# Patient Record
Sex: Female | Born: 1980 | Race: Black or African American | Hispanic: No | Marital: Single | Smoking: Current every day smoker
Health system: Southern US, Community
[De-identification: ages and names within clinical notes are randomized; demographics above are authoritative.]

## PROBLEM LIST (undated history)

## (undated) DIAGNOSIS — F319 Bipolar disorder, unspecified: Secondary | ICD-10-CM

## (undated) DIAGNOSIS — F4310 Post-traumatic stress disorder, unspecified: Secondary | ICD-10-CM

## (undated) HISTORY — PX: HAND SURGERY: SHX662

## (undated) HISTORY — PX: ABDOMINAL HYSTERECTOMY: SHX81

---

## 2009-03-22 ENCOUNTER — Encounter: Admission: RE | Admit: 2009-03-22 | Discharge: 2009-03-22 | Payer: Self-pay | Admitting: Emergency Medicine

## 2009-08-08 ENCOUNTER — Emergency Department (HOSPITAL_COMMUNITY): Admission: EM | Admit: 2009-08-08 | Discharge: 2009-08-09 | Payer: Self-pay | Admitting: Emergency Medicine

## 2009-08-26 ENCOUNTER — Ambulatory Visit (HOSPITAL_COMMUNITY): Admission: RE | Admit: 2009-08-26 | Discharge: 2009-08-26 | Payer: Self-pay | Admitting: Orthopedic Surgery

## 2010-01-08 ENCOUNTER — Emergency Department (HOSPITAL_COMMUNITY)
Admission: EM | Admit: 2010-01-08 | Discharge: 2010-01-08 | Payer: Self-pay | Source: Home / Self Care | Admitting: Emergency Medicine

## 2010-04-08 LAB — COMPREHENSIVE METABOLIC PANEL
AST: 25 U/L (ref 0–37)
Albumin: 4.4 g/dL (ref 3.5–5.2)
Alkaline Phosphatase: 73 U/L (ref 39–117)
CO2: 27 mEq/L (ref 19–32)
Chloride: 106 mEq/L (ref 96–112)
Total Bilirubin: 0.9 mg/dL (ref 0.3–1.2)

## 2010-04-08 LAB — SURGICAL PCR SCREEN
MRSA, PCR: NEGATIVE
Staphylococcus aureus: NEGATIVE

## 2010-04-08 LAB — CBC
HCT: 44.8 % (ref 39.0–52.0)
Hemoglobin: 15.7 g/dL (ref 13.0–17.0)
Platelets: 262 10*3/uL (ref 150–400)
RDW: 12.2 % (ref 11.5–15.5)
WBC: 10 10*3/uL (ref 4.0–10.5)

## 2012-08-27 ENCOUNTER — Emergency Department (HOSPITAL_COMMUNITY)
Admission: EM | Admit: 2012-08-27 | Discharge: 2012-08-27 | Disposition: A | Discharge status: Home / Self Care | Payer: Self-pay | Attending: Emergency Medicine | Admitting: Emergency Medicine

## 2012-08-27 ENCOUNTER — Encounter (HOSPITAL_COMMUNITY): Payer: Self-pay

## 2012-08-27 DIAGNOSIS — F1010 Alcohol abuse, uncomplicated: Secondary | ICD-10-CM | POA: Insufficient documentation

## 2012-08-27 DIAGNOSIS — R45851 Suicidal ideations: Secondary | ICD-10-CM | POA: Insufficient documentation

## 2012-08-27 DIAGNOSIS — F10920 Alcohol use, unspecified with intoxication, uncomplicated: Secondary | ICD-10-CM

## 2012-08-27 LAB — CBC
HCT: 39.2 % (ref 39.0–52.0)
Hemoglobin: 13.4 g/dL (ref 13.0–17.0)
MCH: 30.8 pg (ref 26.0–34.0)

## 2012-08-27 LAB — RAPID URINE DRUG SCREEN, HOSP PERFORMED
Amphetamines: NOT DETECTED
Barbiturates: NOT DETECTED
Cocaine: NOT DETECTED
Tetrahydrocannabinol: POSITIVE — AB

## 2012-08-27 LAB — POCT PREGNANCY, URINE: Preg Test, Ur: NEGATIVE

## 2012-08-27 LAB — COMPREHENSIVE METABOLIC PANEL
AST: 22 U/L (ref 0–37)
Albumin: 4.3 g/dL (ref 3.5–5.2)
Alkaline Phosphatase: 68 U/L (ref 39–117)
CO2: 28 mEq/L (ref 19–32)
Creatinine, Ser: 0.82 mg/dL (ref 0.50–1.35)
Glucose, Bld: 89 mg/dL (ref 70–99)
Potassium: 4.1 mEq/L (ref 3.5–5.1)
Sodium: 141 mEq/L (ref 135–145)

## 2012-08-27 LAB — ETHANOL: Alcohol, Ethyl (B): 76 mg/dL — ABNORMAL HIGH (ref 0–11)

## 2012-08-27 NOTE — ED Notes (Signed)
Dr Juleen China is coming to re-evaluate the patient

## 2012-08-27 NOTE — ED Notes (Signed)
Pt refuses to change into blue scrubs, Dr Juleen China, the [employer], and the nursing staff have tried to talk to the patient and explain the policy. Pt continues to refuse, but is cooperative with blood draw and urine sample

## 2012-08-27 NOTE — ED Notes (Signed)
Pt brought in by police, being IVC by her girlfriend, reported to have jumped out of a car, rode a train, jumped off train, pt has been making suicidal statements.

## 2012-08-27 NOTE — ED Provider Notes (Signed)
  CSN: [id number]     Arrival date & time 08/27/12  0340 History     First MD Initiated Contact with Patient 08/27/12 [phone]     Chief Complaint  Patient presents with  . Medical Clearance   (Consider location/radiation/quality/duration/timing/severity/associated sxs/prior Treatment) HPI  32 year old female on in by police with IVC paperwork. IVC was filled out by patient's girlfriend. Paperwork alleged pt jumped out of a car and also off a train and had been making suicidal statements. Patient's admits to alcohol intoxication. She states that she did get out of car, but that it was not moving at the time. She states that she hopped onto a slow moving train and then jumped off. She states that this behavior was because she was intoxicated and specifically denies intent to harm herself. No homicidal ideation. Denies any ingestion aside from alcohol. No hallucinations. Does not want help for ETOH.   History reviewed. No pertinent past medical history. History reviewed. No pertinent past surgical history. History reviewed. No pertinent family history. History  Substance Use Topics  . Smoking status: Not on file  . Smokeless tobacco: Not on file  . Alcohol Use: Yes    Review of Systems  Level 5 caveat applies because pt is intoxicated.   Allergies  Review of patient's allergies indicates no known allergies.  Home Medications  No current outpatient prescriptions on file. BP 124/81  Pulse 96  Temp(Src) 98.2 F (36.8 C) (Oral)  Resp 15  SpO2 98% Physical Exam  Nursing note and vitals reviewed. Constitutional: He appears well-developed and well-nourished. No distress.  etoh on breath  HENT:  Head: Normocephalic and atraumatic.  Eyes: Conjunctivae are normal. Right eye exhibits no discharge. Left eye exhibits no discharge.  Neck: Neck supple.  Cardiovascular: Normal rate, regular rhythm and normal heart sounds.  Exam reveals no gallop and no friction rub.   No murmur  heard. Pulmonary/Chest: Effort normal and breath sounds normal. No respiratory distress.  Abdominal: Soft. He exhibits no distension. There is no tenderness.  Musculoskeletal: He exhibits no edema and no tenderness.  Neurological: He is alert.  Skin: Skin is warm and dry.  Psychiatric:  Appears intoxicated. Inappropriately loud at times. Some raging when asked to put on paper scrubs.     ED Course   Procedures (including critical care time)  Labs Reviewed  URINE RAPID DRUG SCREEN (HOSP PERFORMED) - Abnormal; Notable for the following:    Tetrahydrocannabinol POSITIVE (*)    All other components within normal limits  CBC  COMPREHENSIVE METABOLIC PANEL  ETHANOL  POCT PREGNANCY, URINE   No results found. 1. Alcohol intoxication, uncomplicated     MDM  32 year old female brought in with IVC paperwork alleged laying suicidal gestures. Patient denies. Reliability is questionable given her intoxication. Will observe until clinically sober and readdress these concerns.  Raeford Razor, MD 08/27/12 [phone]

## 2012-08-27 NOTE — ED Notes (Signed)
Pt was a [employer] in [place] and her girlfriend says "she hasn't been right since"

## 2012-08-27 NOTE — ED Notes (Signed)
Pt sleeping, in no acute distress, pt still refuses to change clothes, Dr Juleen China aware

## 2013-03-19 ENCOUNTER — Encounter (HOSPITAL_COMMUNITY): Payer: Self-pay | Admitting: Emergency Medicine

## 2013-03-19 ENCOUNTER — Emergency Department (HOSPITAL_COMMUNITY)
Admission: EM | Admit: 2013-03-19 | Discharge: 2013-03-19 | Disposition: A | Discharge status: Home / Self Care | Payer: Self-pay | Attending: Emergency Medicine | Admitting: Emergency Medicine

## 2013-03-19 ENCOUNTER — Emergency Department (HOSPITAL_COMMUNITY): Payer: Self-pay

## 2013-03-19 DIAGNOSIS — Y939 Activity, unspecified: Secondary | ICD-10-CM | POA: Insufficient documentation

## 2013-03-19 DIAGNOSIS — W2209XA Striking against other stationary object, initial encounter: Secondary | ICD-10-CM | POA: Insufficient documentation

## 2013-03-19 DIAGNOSIS — S62309A Unspecified fracture of unspecified metacarpal bone, initial encounter for closed fracture: Secondary | ICD-10-CM | POA: Insufficient documentation

## 2013-03-19 DIAGNOSIS — F17200 Nicotine dependence, unspecified, uncomplicated: Secondary | ICD-10-CM | POA: Insufficient documentation

## 2013-03-19 DIAGNOSIS — Z8659 Personal history of other mental and behavioral disorders: Secondary | ICD-10-CM | POA: Insufficient documentation

## 2013-03-19 DIAGNOSIS — Y929 Unspecified place or not applicable: Secondary | ICD-10-CM | POA: Insufficient documentation

## 2013-03-19 DIAGNOSIS — S62306A Unspecified fracture of fifth metacarpal bone, right hand, initial encounter for closed fracture: Secondary | ICD-10-CM

## 2013-03-19 HISTORY — DX: Post-traumatic stress disorder, unspecified: F43.10

## 2013-03-19 MED ORDER — HYDROCODONE-ACETAMINOPHEN 5-325 MG PO TABS: 1.0000 | ORAL_TABLET | ORAL | Status: DC | PRN

## 2013-03-19 NOTE — ED Provider Notes (Signed)
CSN: [id number]     Arrival date & time 03/19/13  1627 History  This chart was scribed for non-physician practitioner, Lowella DellGray Ariannie Penaloza, PA-C working with Gerhard Munchobert Lockwood, MD by Luisa DagoPriscilla Tutu, ED scribe. This patient was seen in room TR08C/TR08C and the patient's care was started at 6:46 PM.    Chief Complaint  Patient presents with  . Hand Injury   The history is provided by the patient. No language interpreter was used.   HPI Comments: Linda Small is a 33 y.o. female who presents to the Emergency Department complaining of a hand injury that occurred 1 day ago. Pt states that she was angered by her therapist. So to release her anger she punched a brick wall. 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 Pt reports a similar episode last year when she punched a wall and broke her hand. to the same hand. She states that it hurts to put her hand in a fist, but rates her current pain as a 4/10.   PMH significant for Hand surgery for fracture of Right Fifth metatarsal.   Past Medical History  Diagnosis Date  . PTSD (post-traumatic stress disorder)    Past Surgical History  Procedure Laterality Date  . Hand surgery    . Abdominal hysterectomy     History reviewed. No pertinent family history. History  Substance Use Topics  . Smoking status: Current Every Day Smoker    Types: Cigars  . Smokeless tobacco: Not on file     Comment: one black and mild a day  . Alcohol Use: Yes     Comment: occasionally   OB History   Grav Para Term Preterm Abortions TAB SAB Ect Mult Living                 Review of Systems  Musculoskeletal: Negative for joint swelling.  Skin: Positive for wound (right hand).  All other systems reviewed and are negative.      Allergies  Review of patient's allergies indicates no known allergies.  Home Medications   Current Outpatient Rx  Name  Route  Sig   Dispense  Refill  . HYDROcodone-acetaminophen (NORCO/VICODIN) 5-325 MG per tablet   Oral   Take 1 tablet by mouth every 4 (four) hours as needed.   15 tablet   0     BP 123/86  Pulse 93  Temp(Src) 98.2 F (36.8 C) (Oral)  Resp 18  Wt 112 lb (50.803 kg)  SpO2 99%  Physical Exam  Nursing note and vitals reviewed. Constitutional: She is oriented to person, place, and time. She appears well-developed and well-nourished. No distress.  HENT:  Head: Normocephalic and atraumatic.  Eyes: Conjunctivae and EOM are normal.  Neck: Neck supple. No JVD present. No tracheal deviation present.  Cardiovascular: Normal rate and regular rhythm.  Exam reveals no gallop and no friction rub.   No murmur heard. Pulmonary/Chest: Effort normal. No respiratory distress. She has no wheezes. She has no rhonchi. She has no rales.  Musculoskeletal: Normal range of motion. She exhibits no edema.       Arms: Tenderness at distal end of 5th metacarpal of RIght hand, with associated swelling and ecchymosis.   Neurological: She is alert and oriented to person, place, and time.  Skin: Skin is warm and dry. She is not diaphoretic.  Psychiatric: She has a normal mood and affect.  Her behavior is normal.    ED Course  Procedures (including critical care time)  DIAGNOSTIC STUDIES: Oxygen Saturation is 99% on RA, normal by my interpretation.    COORDINATION OF CARE: 6:53 PM- Will order a splint. Pt advised of plan for treatment and pt agrees.  Labs Review Labs Reviewed - No data to display Imaging Review Dg Hand Complete Right  03/19/2013   CLINICAL DATA:  Injury.  EXAM: RIGHT HAND - COMPLETE 3+ VIEW  COMPARISON:  DG HAND COMPLETE*R* dated 08/08/2009  FINDINGS: There is a new distal right fifth metacarpal angulated fracture. The patient has had prior plate and screw fixation of a mid right fifth metacarpal fracture. This has healed. No other abnormality.  IMPRESSION: 1. New fracture of the distal shaft of the  right fifth metacarpal with mild volar angulation deformity. 2. Old healed fracture right mid fifth metacarpal with plate and screw fixation .   Electronically Signed   By: Maisie Fus  Register   On: 03/19/2013 17:42    EKG Interpretation   None       MDM   Final diagnoses:  Fracture of fifth metacarpal bone of right hand   Vital Signs WNL Plain films show new fracture to distal shaft of right fifth metacarpal.  Plan to have patient follow up with orthopedics for further management of fracture. Patient placed in ulnar gutter splint. Patient agrees with plan. Discharged in good condition.   Meds given in ED:  Medications - No data to display  Discharge Medication List as of 03/19/2013  7:46 PM    START taking these medications   Details  HYDROcodone-acetaminophen (NORCO/VICODIN) 5-325 MG per tablet Take 1 tablet by mouth every 4 (four) hours as needed., Starting 03/19/2013, Until Discontinued, Print        I personally performed the services described in this documentation, which was scribed in my presence. The recorded information has been reviewed and is accurate.    Rudene Anda, PA-C 03/20/13 2137

## 2013-03-19 NOTE — Discharge Instructions (Signed)
Follow up with orthopedics as soon as possible for further management of your fracture.    Boxer's Fracture You have a break (fracture) of the fifth metacarpal bone. This is commonly called a boxer's fracture. This is the bone in the hand where the little finger attaches. The fracture is in the end of that bone, closest to the little finger. It is usually caused when you hit an object with a clenched fist. Often, the knuckle is pushed down by the impact. Sometimes, the fracture rotates out of position. A boxer's fracture will usually heal within 6 weeks, if it is treated properly and protected from re-injury. Surgery is sometimes needed. A cast, splint, or bulky hand dressing may be used to protect and immobilize a boxer's fracture. Do not remove this device or dressing until your caregiver approves. Keep your hand elevated, and apply ice packs for 15-20 minutes every 2 hours, for the first 2 days. Elevation and ice help reduce swelling and relieve pain. See your caregiver, or an orthopedic specialist, for follow-up care within the next 10 days. This is to make sure your fracture is healing properly. Document Released: 01/09/2005 Document Revised: 04/03/2011 Document Reviewed: 06/29/2006 [facility] Patient Information 2014 [place], [place].  Cast or Splint Care Casts and splints support injured limbs and keep bones from moving while they heal.  HOME CARE  Keep the cast or splint uncovered during the drying period.  A plaster cast can take 24 to 48 hours to dry.  A fiberglass cast will dry in less than 1 hour.  Do not rest the cast on anything harder than a pillow for 24 hours.  Do not put weight on your injured limb. Do not put pressure on the cast. Wait for your doctor's approval.  Keep the cast or splint dry.  Cover the cast or splint with a plastic bag during baths or wet weather.  If you have a cast over your chest and belly (trunk), take sponge baths until the cast is taken off.  If  your cast gets wet, dry it with a towel or blow dryer. Use the cool setting on the blow dryer.  Keep your cast or splint clean. Wash a dirty cast with a damp cloth.  Do not put any objects under your cast or splint.  Do not scratch the skin under the cast with an object. If itching is a problem, use a blow dryer on a cool setting over the itchy area.  Do not trim or cut your cast.  Do not take out the padding from inside your cast.  Exercise your joints near the cast as told by your doctor.  Raise (elevate) your injured limb on 1 or 2 pillows for the first 1 to 3 days. GET HELP IF:  Your cast or splint cracks.  Your cast or splint is too tight or too loose.  You itch badly under the cast.  Your cast gets wet or has a soft spot.  You have a bad smell coming from the cast.  You get an object stuck under the cast.  Your skin around the cast becomes red or sore.  You have new or more pain after the cast is put on. GET HELP RIGHT AWAY IF:  You have fluid leaking through the cast.  You cannot move your fingers or toes.  Your fingers or toes turn blue or white or are cool, painful, or puffy (swollen).  You have tingling or lose feeling (numbness) around the injured area.  You have bad pain or pressure under the cast.  You have trouble breathing or have shortness of breath.  You have chest pain. Document Released: 05/11/2010 Document Revised: 09/11/2012 Document Reviewed: 07/18/2012 [facility] Patient Information 2014 [place].

## 2013-03-19 NOTE — ED Notes (Signed)
Pt. Stated, i got mad and hit the wall injured rt. Hand.

## 2013-03-19 NOTE — Progress Notes (Signed)
Orthopedic Tech Progress Note Patient Details:  Linda Small 1980/06/18 [id number]  Ortho Devices Type of Ortho Device: Ace wrap;Ulna gutter splint Ortho Device/Splint Location: RUE Ortho Device/Splint Interventions: Ordered;Application   Jennye MoccasinHughes, [name] 03/19/2013, 7:10 PM

## 2013-03-19 NOTE — ED Notes (Signed)
PT ambulated with baseline gait; VSS; A&Ox3; no signs of distress; respirations even and unlabored; skin warm and dry; no questions upon discharge.  

## 2013-03-19 NOTE — ED Notes (Signed)
Pt reports hitting wall yesterday, having pain and limited movement in hand; pt reports has a plate in hand from previous injury

## 2013-03-20 NOTE — ED Provider Notes (Signed)
Medical screening examination/treatment/procedure(s) were performed by non-physician practitioner and as supervising physician I was immediately available for consultation/collaboration.  Gerhard Munchobert Kanika Bungert, MD 03/20/13 [phone]

## 2014-02-25 ENCOUNTER — Encounter (HOSPITAL_COMMUNITY): Payer: Self-pay | Admitting: *Deleted

## 2014-02-25 ENCOUNTER — Emergency Department (HOSPITAL_COMMUNITY)
Admission: EM | Admit: 2014-02-25 | Discharge: 2014-02-28 | Disposition: A | Discharge status: Other Acute Inpatient Hospital | Payer: Self-pay | Attending: Emergency Medicine | Admitting: Emergency Medicine

## 2014-02-25 DIAGNOSIS — W19XXXA Unspecified fall, initial encounter: Secondary | ICD-10-CM

## 2014-02-25 DIAGNOSIS — F1210 Cannabis abuse, uncomplicated: Secondary | ICD-10-CM | POA: Insufficient documentation

## 2014-02-25 DIAGNOSIS — F29 Unspecified psychosis not due to a substance or known physiological condition: Secondary | ICD-10-CM | POA: Diagnosis present

## 2014-02-25 DIAGNOSIS — Z720 Tobacco use: Secondary | ICD-10-CM | POA: Insufficient documentation

## 2014-02-25 DIAGNOSIS — R000 Tachycardia, unspecified: Secondary | ICD-10-CM | POA: Insufficient documentation

## 2014-02-25 DIAGNOSIS — F319 Bipolar disorder, unspecified: Secondary | ICD-10-CM | POA: Diagnosis present

## 2014-02-25 DIAGNOSIS — Z3202 Encounter for pregnancy test, result negative: Secondary | ICD-10-CM | POA: Insufficient documentation

## 2014-02-25 DIAGNOSIS — F4310 Post-traumatic stress disorder, unspecified: Secondary | ICD-10-CM | POA: Diagnosis present

## 2014-02-25 DIAGNOSIS — F1310 Sedative, hypnotic or anxiolytic abuse, uncomplicated: Secondary | ICD-10-CM | POA: Insufficient documentation

## 2014-02-25 HISTORY — DX: Bipolar disorder, unspecified: F31.9

## 2014-02-25 LAB — COMPREHENSIVE METABOLIC PANEL
ALT: 27 U/L (ref 0–35)
ANION GAP: 13 (ref 5–15)
AST: 37 U/L (ref 0–37)
Albumin: 4.9 g/dL (ref 3.5–5.2)
Alkaline Phosphatase: 63 U/L (ref 39–117)
BILIRUBIN TOTAL: 0.9 mg/dL (ref 0.3–1.2)
BUN: 16 mg/dL (ref 6–23)
CO2: 23 mmol/L (ref 19–32)
Calcium: 10 mg/dL (ref 8.4–10.5)
Chloride: 105 mmol/L (ref 96–112)
Creatinine, Ser: 0.84 mg/dL (ref 0.50–1.10)
GFR calc Af Amer: >90 mL/min (ref >90)
GLUCOSE: 102 mg/dL — AB (ref 70–99)
POTASSIUM: 3.5 mmol/L (ref 3.5–5.1)
Sodium: 141 mmol/L (ref 135–145)
TOTAL PROTEIN: 8 g/dL (ref 6.0–8.3)

## 2014-02-25 LAB — PREGNANCY, URINE: Preg Test, Ur: NEGATIVE

## 2014-02-25 LAB — CBC
HEMATOCRIT: 39.1 % (ref 36.0–46.0)
HEMOGLOBIN: 13.3 g/dL (ref 12.0–15.0)
MCH: 31.6 pg (ref 26.0–34.0)
MCHC: 34 g/dL (ref 30.0–36.0)
MCV: 92.9 fL (ref 78.0–100.0)
Platelets: 274 10*3/uL (ref 150–400)
RBC: 4.21 MIL/uL (ref 3.87–5.11)
RDW: 13.5 % (ref 11.5–15.5)
WBC: 14.6 10*3/uL — AB (ref 4.0–10.5)

## 2014-02-25 LAB — RAPID URINE DRUG SCREEN, HOSP PERFORMED
AMPHETAMINES: NOT DETECTED
Barbiturates: NOT DETECTED
Benzodiazepines: POSITIVE — AB
COCAINE: NOT DETECTED
OPIATES: NOT DETECTED
Tetrahydrocannabinol: POSITIVE — AB

## 2014-02-25 LAB — ETHANOL

## 2014-02-25 LAB — SALICYLATE LEVEL

## 2014-02-25 LAB — ACETAMINOPHEN LEVEL

## 2014-02-25 MED ORDER — LORAZEPAM 1 MG PO TABS
1.0000 mg | ORAL_TABLET | Freq: Three times a day (TID) | ORAL | Status: DC | PRN
  Administered 2014-02-26 – 2014-02-27 (×3): 1 mg via ORAL
  Filled 2014-02-25 (×4): qty 1

## 2014-02-25 MED ORDER — ACETAMINOPHEN 325 MG PO TABS
650.0000 mg | ORAL_TABLET | ORAL | Status: DC | PRN
  Filled 2014-02-25: qty 2

## 2014-02-25 MED ORDER — SODIUM CHLORIDE 0.9 % IV BOLUS (SEPSIS)
1000.0000 mL | Freq: Once | INTRAVENOUS | Status: AC
  Administered 2014-02-25: 1000 mL via INTRAVENOUS

## 2014-02-25 MED ORDER — ONDANSETRON HCL 4 MG PO TABS: 4.0000 mg | ORAL_TABLET | Freq: Three times a day (TID) | ORAL | Status: DC | PRN

## 2014-02-25 MED ORDER — ALUM & MAG HYDROXIDE-SIMETH 200-200-20 MG/5ML PO SUSP: 30.0000 mL | ORAL | Status: DC | PRN

## 2014-02-25 MED ORDER — LORAZEPAM 2 MG/ML IJ SOLN
1.0000 mg | Freq: Once | INTRAMUSCULAR | Status: AC
  Administered 2014-02-25: 1 mg via INTRAVENOUS
  Filled 2014-02-25: qty 1

## 2014-02-25 MED ORDER — ZOLPIDEM TARTRATE 5 MG PO TABS
5.0000 mg | ORAL_TABLET | Freq: Every evening | ORAL | Status: DC | PRN
  Administered 2014-02-26: 5 mg via ORAL
  Filled 2014-02-25: qty 1

## 2014-02-25 MED ORDER — IBUPROFEN 200 MG PO TABS
600.0000 mg | ORAL_TABLET | Freq: Three times a day (TID) | ORAL | Status: DC | PRN
Start: 2014-02-25 — End: 2014-02-28
  Administered 2014-02-27: 600 mg via ORAL
  Filled 2014-02-25 (×2): qty 3

## 2014-02-25 NOTE — ED Notes (Addendum)
Per EMS, pt from home, reports hx of PTSD and has been having "outbursts."  Reports ran around naked outside.  Pt also has hx of bipolar.   Pt was given 5mg  versed en route.  Pt is asleep at present.  Pt's friend at bedside reports that at times, pt would make "gun sounds" and would say to her that she is at war and is trying to "fight off the demon."  She also reports that when she arrived in pt's house, pt was sitting "[place] style and was leaning back and forth."  Pt's mother reports that pt was told that she has bipolar [facility] but was not placed on meds.

## 2014-02-25 NOTE — ED Provider Notes (Signed)
CSN: [id number]     Arrival date & time 02/25/14  1741 History   First MD Initiated Contact with Patient 02/25/14 1752     Chief Complaint  Patient presents with  . Medical Clearance     (Consider location/radiation/quality/duration/timing/severity/associated sxs/prior Treatment) HPI Comments: Linda Small is a 34 y.o. female with a PMHx of PTSD and bipolar 1 disorder, who presents to the ED via EMS accompanied by her mother who provides all of the history along with EMS reports due to the pt being given versed and being asleep at the time of arrival. Level 5 caveat, pt somnolent due to meds given and unable to provide any history. Patient's mother reports that last Wednesday she had some increase in stress due to a job working out, and her friend mention to the mom that she has been pretending like she is at war, stating that she is trying to "fight off demons" and making gun sounds pointing her finger as if it were gun. Her mother reports that today the EMS arrived, and that initially the patient was "sitting [place] style and rocking back and forth", but that the patient eventually willingly got into the ambulance and took a shot of medication. Per EMS report, the patient was given 5 mg of Versed en Route which has caused her to be completely somnolent. Mother states that she doesn't know if the pt has been suicidal or not, but that she has a hx of bipolar and PTSD and isn't currently medicated. [facility]. Mother denies any medical conditions, but can't report whether the pt has had any medical complaints recently. Unable to obtain full history.  Patient is a 34 y.o. female presenting with mental health disorder. The history is provided by the patient and the EMS personnel. No language interpreter was used.  Mental Health Problem Presenting symptoms: bizarre behavior and hallucinations   Patient accompanied by:  Family member Onset quality:   Gradual Duration:  1 week Timing:  Constant Progression:  Worsening Chronicity:  Recurrent Context: stressful life event   Treatment compliance:  Untreated Relieved by:  None tried Worsened by:  Nothing tried Ineffective treatments:  None tried Risk factors: hx of mental illness and recent psychiatric admission     Past Medical History  Diagnosis Date  . PTSD (post-traumatic stress disorder)   . Bipolar 1 disorder    Past Surgical History  Procedure Laterality Date  . Hand surgery    . Abdominal hysterectomy     No family history on file. History  Substance Use Topics  . Smoking status: Current Every Day Smoker    Types: Cigars  . Smokeless tobacco: Not on file     Comment: one black and mild a day  . Alcohol Use: Yes     Comment: occasionally   OB History    No data available      LEVEL 5 CAVEAT: PT SLEEPING DUE TO VERSED GIVEN PTA, UNABLE TO PROVIDE HISTORY  Review of Systems  Unable to perform ROS: Other  Psychiatric/Behavioral: Positive for hallucinations.   10 Systems reviewed and are negative for acute change except as noted in the HPI.    Allergies  Review of patient's allergies indicates no known allergies.  Home Medications   Prior to Admission medications   Medication Sig Start Date End Date Taking? Authorizing Provider  HYDROcodone-acetaminophen (NORCO/VICODIN) 5-325 MG per tablet Take 1 tablet by mouth every 4 (four) hours as needed.  03/19/13   Rudene Anda, PA-C   BP 98/66 mmHg  Pulse 91  Temp(Src) 97.8 F (36.6 C) (Axillary)  Resp 20  SpO2 96% Physical Exam  Constitutional: She appears well-developed and well-nourished. She is sleeping.  Non-toxic appearance. No distress.  Afebrile nontoxic, sleeping and not easily aroused, snoring respirations, BP slightly low at 98/66 but pt stable at this time  HENT:  Head: Normocephalic and atraumatic.  Mouth/Throat: Mucous membranes are normal.  Eyes: Conjunctivae are normal. Pupils are equal,  round, and reactive to light. Right eye exhibits no discharge. Left eye exhibits no discharge.  Unable to assess EOM due to pt sleeping  Neck: Normal range of motion. Neck supple.  Cardiovascular: Normal rate, regular rhythm, normal heart sounds and intact distal pulses.  Exam reveals no gallop and no friction rub.   No murmur heard. Pulmonary/Chest: Effort normal and breath sounds normal. No respiratory distress. She has no decreased breath sounds. She has no wheezes. She has no rhonchi. She has no rales.  Snoring respirations but no w/r/r, CTAB in all lung fields, no hypoxia or increased WOB  Abdominal: Soft. Normal appearance and bowel sounds are normal. She exhibits no distension.  Soft, nondistended, +BS throughout. Unable to verify if pt is nontender due to patient being asleep  Musculoskeletal:  Pt asleep, unable to assess  Neurological:  Unable to assess due to pt sleeping  Skin: Skin is warm, dry and intact. No rash noted.  Psychiatric:  Unable to assess due to pt sleeping  Nursing note and vitals reviewed.   ED Course  Procedures (including critical care time) Labs Review Labs Reviewed  ACETAMINOPHEN LEVEL - Abnormal; Notable for the following:    Acetaminophen (Tylenol), Serum <10.0 (*)    All other components within normal limits  CBC - Abnormal; Notable for the following:    WBC 14.6 (*)    All other components within normal limits  COMPREHENSIVE METABOLIC PANEL - Abnormal; Notable for the following:    Glucose, Bld 102 (*)    All other components within normal limits  URINE RAPID DRUG SCREEN (HOSP PERFORMED) - Abnormal; Notable for the following:    Benzodiazepines POSITIVE (*)    Tetrahydrocannabinol POSITIVE (*)    All other components within normal limits  ETHANOL  SALICYLATE LEVEL  PREGNANCY, URINE    Imaging Review No results found.   EKG Interpretation None      MDM   Final diagnoses:  PTSD (post-traumatic stress disorder)    34 y.o. female  here via EMS, had versed  en route therefore pt completely asleep and not arousable. Mother provides some history, stating she was having "outbursts" of PTSD, doesn't know if pt endorsed any suicidal ideations but she's been acting like she's shooting at people in war. Seems to be somewhat like a hallucination vs flashback. Pt unable to report anything at this time due to medication effects. Will get labs and await for pt to be less somnolent.   7:23 PM Pt having episodes of shaking and tachycardic, I witnessed this and it appears to be consistent with flashbacks that the friend was describing to EMS. Friend now at bedside and states this is what she was previously doing. More history obtained now, friend states that she has not endorsed SI/HI, but she appears to be having hallucinations. Also states that pt did not come here willingly which is why EMS gave her versed, but no IVC paperwork was done. Friend also states that no medical complaints  have been reported to her. States that for the last three days she didn't sleep so this could explain her possible psychotic break and sleepiness now. Nursing states that approx 10-60mins ago, pt was cooperative and verbal, and denied having any pain or medical related symptoms. She did not ask specifics, but pt denied overall medical symptoms of feeling unwell. Pt doesn't talk to me now, intermittently she looks at me but then continues to shake and scream. HR goes up to 130s, once documented at 150s, but always returns down. Appears to be sinus tachy on the monitor. IV started and will give fluids. Will give ativan now as I believe these are flashbacks related to PTSD. Labs so far showing WBC of 14.6, nonspecific, could be demargination. CMP WNL. Tylenol and EtOH and salicylate levels all WNL. UDS pending. Will reassess shortly, hopefully can get more out of pt once she's more lucid, but so far pt seems to be medically cleared as far as I can tell.   9:57 PM Pt  sleeping soundly now, won't arouse when I attempt to wake her up but I question whether this is due to not wanting to talk vs being altered, since pt has previously had a coherent conversation with nursing. Will attempt to arouse shortly but if unable to, pt will need to be moved to psych ED for further evaluation since this all appears to be psychological. Will add urine preg to ensure this isn't playing a part, but otherwise will consult TTS.  10:20 PM Upreg neg. Will consult TTS now, and move to psych ED. Holding orders placed. Medically cleared at this time.   Donnita [place], PA-C 02/25/14 2220  Richardean Canal, MD 02/25/14 [phone]

## 2014-02-25 NOTE — ED Notes (Signed)
Unable to get urine sample at this time, PA is aware, Patient is in a deep sleep

## 2014-02-25 NOTE — ED Notes (Signed)
Linda DecemberSharon Small/mother  (home) [phone]  (cell) [phone]    Gregery NaHenry Small/father  (cell) [phone]

## 2014-02-26 ENCOUNTER — Emergency Department (HOSPITAL_COMMUNITY): Payer: Self-pay

## 2014-02-26 DIAGNOSIS — F29 Unspecified psychosis not due to a substance or known physiological condition: Secondary | ICD-10-CM | POA: Diagnosis present

## 2014-02-26 DIAGNOSIS — F4310 Post-traumatic stress disorder, unspecified: Secondary | ICD-10-CM | POA: Diagnosis present

## 2014-02-26 MED ORDER — HYDROCODONE-ACETAMINOPHEN 5-325 MG PO TABS
1.0000 | ORAL_TABLET | Freq: Once | ORAL | Status: AC
  Administered 2014-02-26: 1 via ORAL
  Filled 2014-02-26: qty 1

## 2014-02-26 MED ORDER — DIPHENHYDRAMINE HCL 50 MG/ML IJ SOLN
25.0000 mg | Freq: Once | INTRAMUSCULAR | Status: AC
  Administered 2014-02-26: 25 mg via INTRAMUSCULAR
  Filled 2014-02-26: qty 1

## 2014-02-26 MED ORDER — ZIPRASIDONE MESYLATE 20 MG IM SOLR
20.0000 mg | Freq: Once | INTRAMUSCULAR | Status: AC
  Administered 2014-02-26: 20 mg via INTRAMUSCULAR
  Filled 2014-02-26: qty 20

## 2014-02-26 MED ORDER — OLANZAPINE 5 MG PO TBDP
5.0000 mg | ORAL_TABLET | Freq: Two times a day (BID) | ORAL | Status: DC
  Administered 2014-02-26 – 2014-02-28 (×5): 5 mg via ORAL
  Filled 2014-02-26 (×5): qty 1

## 2014-02-26 MED ORDER — SODIUM CHLORIDE 0.9 % IV SOLN: INTRAVENOUS | Status: DC

## 2014-02-26 MED ORDER — ONDANSETRON HCL 4 MG/2ML IJ SOLN: 4.0000 mg | Freq: Once | INTRAMUSCULAR | Status: DC

## 2014-02-26 MED ORDER — LORAZEPAM 2 MG/ML IJ SOLN
1.0000 mg | Freq: Once | INTRAMUSCULAR | Status: AC
  Administered 2014-02-26: 1 mg via INTRAMUSCULAR
  Filled 2014-02-26: qty 1

## 2014-02-26 MED ORDER — HALOPERIDOL LACTATE 5 MG/ML IJ SOLN
5.0000 mg | Freq: Once | INTRAMUSCULAR | Status: AC
  Administered 2014-02-26: 5 mg via INTRAMUSCULAR
  Filled 2014-02-26: qty 1
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
## 2014-02-26 NOTE — ED Notes (Signed)
Patient unable to swallow medication due to sedation.

## 2014-02-26 NOTE — ED Notes (Signed)
Pt with an episode of shaking. Pt still verbal and able to talk with nurse. However pt's HR up into 160's. Pt still on monitor. Writer was informed in report that pt's HR going up into the 160's during her shaking episodes is normal for pt. MD is already aware of pt's episodes and does not think this is seizure activity. Will continue to monitor.
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
## 2014-02-26 NOTE — ED Notes (Signed)
Patient requested sleeping medication.  Patient informed that sleeping medications could not be given during the day hours as not to interrupt her normal sleeping patterns.
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
## 2014-02-26 NOTE — Progress Notes (Signed)
CSW contacted [facility] regarding referral for inpatient psychiatric. Per Archie Pattenonya patient will need doctor to doctor referral. CSW will discuss with EDP, to assist with referral. EDP can call [facility] at [phone] to speak with receiving doctor regarding possible admission.   Byrd HesselbachKristen Danny Zimny, LCSW [phone]  ED CSW 02/26/2014 1:18 PM
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
## 2014-02-26 NOTE — ED Notes (Signed)
Patient moved to room 28 for better patient viewing from nursing station

## 2014-02-26 NOTE — Progress Notes (Signed)
Pt asleep when Cm went to visit her.  Cm will attempt to re visit pt later.

## 2014-02-26 NOTE — ED Provider Notes (Signed)
[id number] - TTS called to notify of plan for psychiatric evaluation in AM. Psych hold orders placed by prior provider.  Antony MaduraKelly Kensli Bowley, PA-C 02/26/14 [id number]  Ward GivensIva L Knapp, MD 02/26/14 [phone]
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
## 2014-02-26 NOTE — Progress Notes (Signed)
CSW received call from pt's dad who expressed his frustrations about the patient possible being admitted [facility]. Dad states that he wants the patient to be [facility]. CSW informed dad that [facility] and other hospitals are being pursued.  Dad stated " My daughter is not a slave. She is nobody's property."  Also, dad states " If I knew how [place] worked. I would not of brought [facility]." Dad is persistent in the fact that he wants patient admitted to [facility] in order for him to be able to visit patient. CSW made it clear that [facility] is a [facility], but it is not a guarantee.   Dad states that he cannot come into [facility] today due to having a procedure done on his eyes. He says that he is not able to drive. However, according to dad he plans to visit patient tomorrow.   Trish MageBrittney Johngabriel Verde, LCSWA [phone] ED CSW 02/26/2014 6:22 PM

## 2014-02-26 NOTE — ED Notes (Addendum)
Francis GainesSharron Young [phone]  Password: Marice Potterove  Patient verified that information can be given to her mother Girlfriend was in the room at a witness

## 2014-02-26 NOTE — Progress Notes (Signed)
CSW spoke with [facility] at [facility], who confirmed no beds a this time and to pursue other hospital options. Tresa EndoKelly shared that patient is 30% service connected, meaning patient has some coverage related to medical/pscyhiatric disabilities resulting from service will be covered.   Byrd HesselbachKristen Jack Mineau, LCSW [phone]  ED CSW 02/26/2014 2:51 PM

## 2014-02-26 NOTE — ED Notes (Signed)
Mother of patient was informed of the [facility] [place] [facility] [place] [facility].  Mother states "I don't want my daughter to go there.  I am going to come Get her and take [facility]"  I informed the mother that her daughter is an  Adult and that she does not have to ability to remove her from the facility.  Due to the  Fact that the patient is voluntary at this time and does not have to capacity to make  Her own decisions it has been recommended that the patient be IVC to ensure no  Outside influence can occur.
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
## 2014-02-26 NOTE — Progress Notes (Addendum)
CSW spoke with pt father, who was inquiring about patient care. CSW shared that patient was seen by psychiatric treatment team, and recommended for inpatient treatment at this time. CSW explained the process of being obligated [facility] [place], and then patient can be considered at other local hospitals. Patient father advocated for patient to be [facility], as patient was a patient a few years ago, and parents are local. CSW explained that patient can be referred and explained the process of a Doctor to Doctor referral. Patient father verbalized understanding and thanked CSW for concern and support. Pt father requested CSW to also speak with pt mother to help explain process. Pt father stated they would be coming to visit patient in the ED tomorrow if still here, however limited due to some medical issues today. CSW awaiting call from pt mother, and pt father wanted to talk with pt mother first.   Byrd HesselbachKristen Jameah Rouser, LCSW [phone]  ED CSW 02/26/2014 1:16 PM   CSW spoke with pt mother who verbalized understanding of process to obtain inpatient treatment for patient. Patient mother advocated for patient to [facility] as well. CSW ensured that every effort would be made for [facility] however it was not guarantee. CSW and pt mother discussed the importance of patient receiving treatment in a therapeutic environment in a psychiatric hospital. Pt mother verbalized agreement in seeking inpatient treatment.   Byrd HesselbachKristen Victorina Kable, LCSW [phone]  ED CSW 02/26/2014 1:56 PM
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
## 2014-02-26 NOTE — Progress Notes (Signed)
Spoke with pt who is now await. Female friend at bedside tearful Pt cooperative Pt informed CM [facility] Unable to recall the last name but states pcp first name is [place]

## 2014-02-26 NOTE — ED Notes (Signed)
Placed call to staffing for safety sitter for pt. Pt remains a high fall risk and with her multiple attempts to get out of bed and a high risk for injury with new injury onset today from fall. Pt is not coherent when trying to get out of bed and does so quickly.   Ortho at bedside to apply splint.

## 2014-02-27 ENCOUNTER — Emergency Department (HOSPITAL_COMMUNITY): Payer: Self-pay

## 2014-02-27 DIAGNOSIS — F4310 Post-traumatic stress disorder, unspecified: Secondary | ICD-10-CM

## 2014-02-27 DIAGNOSIS — F29 Unspecified psychosis not due to a substance or known physiological condition: Secondary | ICD-10-CM

## 2014-02-27 DIAGNOSIS — F319 Bipolar disorder, unspecified: Secondary | ICD-10-CM | POA: Diagnosis present

## 2014-02-27 MED ORDER — HYDROCODONE-ACETAMINOPHEN 5-325 MG PO TABS
2.0000 | ORAL_TABLET | Freq: Four times a day (QID) | ORAL | Status: DC | PRN
  Administered 2014-02-27 – 2014-02-28 (×4): 2 via ORAL
  Filled 2014-02-27 (×4): qty 2

## 2014-02-27 MED ORDER — MIRTAZAPINE 7.5 MG PO TABS
7.5000 mg | ORAL_TABLET | Freq: Every day | ORAL | Status: DC
  Administered 2014-02-27: 7.5 mg via ORAL
  Filled 2014-02-27 (×3): qty 1

## 2014-02-27 MED ORDER — LORAZEPAM 1 MG PO TABS
1.0000 mg | ORAL_TABLET | ORAL | Status: DC | PRN
  Administered 2014-02-27: 1 mg via ORAL

## 2014-02-27 MED ORDER — LORAZEPAM 1 MG PO TABS
1.0000 mg | ORAL_TABLET | ORAL | Status: DC | PRN
  Administered 2014-02-27 – 2014-02-28 (×5): 1 mg via ORAL
  Filled 2014-02-27 (×5): qty 1

## 2014-02-27 MED ORDER — LORAZEPAM 0.5 MG PO TABS
0.5000 mg | ORAL_TABLET | Freq: Two times a day (BID) | ORAL | Status: DC
  Administered 2014-02-27: 0.5 mg via ORAL
  Filled 2014-02-27 (×2): qty 1

## 2014-02-27 NOTE — ED Provider Notes (Signed)
The patient has been accepted [facility] per [facility] work. The accepting is Wende CreaseJane Russom.  The patient was evaluated by myself for transfer. She is alert and appropriate eating a meal tray. She had requested explanation regarding her wrist fracture which has been discussed. The splint is in good placement with the patient neurovascularly intact. At this time she appears stable for transfer.  Arby BarretteMarcy Iain Sawchuk, MD 02/27/14 2231

## 2014-02-27 NOTE — BHH Counselor (Signed)
Faxed over referral information to [facility] attention to [employer] Humph[employer].   Kateri PlummerKristin Keriann Rankin, M.S., LPCA, [place], [facility] Licensed Professional Counselor Associate  Triage Specialist  [facility]  Therapeutic Triage Services Phone: [phone] Fax: [phone]

## 2014-02-27 NOTE — ED Notes (Signed)
MD and family at the bedside

## 2014-02-27 NOTE — ED Notes (Signed)
Family at bedside. 

## 2014-02-27 NOTE — Consult Note (Signed)
[facility] Face-to-Face Psychiatry Consult   Reason for Consult:  Psychosis, mood swings, confused. Referring Physician:  EDP Patient Identification: Linda Small MRN:  [id number] Principal Diagnosis: Psychosis Diagnosis:   Patient Active Problem List   Diagnosis Date Noted  . Bipolar affective [F31.9] 02/27/2014    Priority: High  . PTSD (post-traumatic stress disorder) [F43.10]     Priority: High  . Psychosis [F29] 02/26/2014    Total Time spent with patient: 25 minutes  Subjective:   Linda Small is a 34 y.o. female patient admitted with psychosis, labile mood and  PTSD.  HPI: Patient seen and chart reviewed. She is a 34 years old woman with history of PTSD, Marijuana addiction and Bipolar disorder who was brought to the hospital for evaluation of psychosis.  Per report, has been having outburst, erratic and bizarre behavior and was found running around naked outside of her  home.  Patient is still confused, disoriented, looked dazed, staring at the ceiling and not making eye contact with provider.  Patient was not able to answer most of our questions to her but pauses and mutter out some words.  Patient occasionally made some jerking movements of her Upper extremities. Patient reported that she has not been sleeping for few days.  She states that she is not compliant with her medications but has been self medicating with Cannabis on a daily basis. Patient stated that she uses Marijuana to fight off "Demon". Spoke to patient's parents who reports that patient had a breakdown about 4 years ago, she was treated at [facility] where she was diagnosed with PTSD and Bipolar disorder. They acknowledge that patient is not compliant with her medications. However, they are requesting for her to be referred to [facility] for inpatient treatment if necessary. They live in [place] and wants an hospital not far from them.   HPI Elements:   Location:  Psychosis, hx of PTSD. Quality:   SEVERE, OCCASIONAL JERKING MOVEMENT NOTED, . Severity:  severe. Duration:  Chronic mental illness, Marijuana abuse. Context:  Brought in by EMS for treatment of agitation and Psychosis.  Past Medical History:  Past Medical History  Diagnosis Date  . PTSD (post-traumatic stress disorder)   . Bipolar 1 disorder     Past Surgical History  Procedure Laterality Date  . Hand surgery    . Abdominal hysterectomy     Family History: No family history on file. Social History:  History  Alcohol Use  . Yes    Comment: occasionally     History  Drug Use  . Yes  . Special: Marijuana    History   Social History  . Marital Status: Single    Spouse Name: N/A    Number of Children: N/A  . Years of Education: N/A   Social History Main Topics  . Smoking status: Current Every Day Smoker    Types: Cigars  . Smokeless tobacco: None     Comment: one black and mild a day  . Alcohol Use: Yes     Comment: occasionally  . Drug Use: Yes    Special: Marijuana  . Sexual Activity: None   Other Topics Concern  . None   Social History Narrative   Additional Social History:    Pain Medications: denies Prescriptions: reports does not take medications Over the Counter: See PTA History of alcohol / drug use?: Yes Longest period of sobriety (when/how long): none for THC, unsure for etoh reports recently slowed use  this past weekend Negative Consequences of Use:  (denies) Withdrawal Symptoms:  (reports stomach hurts, shaking) Name of Substance 1: etoh 1 - Age of First Use: 18 1 - Amount (size/oz): 40 ounces 1 - Frequency: daily  1 - Duration: years 1 - Last Use / Amount: about 5 days ago  Name of Substance 2: THC 2 - Age of First Use: 18 2 - Amount (size/oz): reports she chain smokes THC all day  2 - Frequency: daily, all day  2 - Duration: years 2 - Last Use / Amount: reports spaced out use this past weekend    Allergies:  No Known Allergies  Vitals: Blood pressure 112/64, pulse  100, temperature 98 F (36.7 C), temperature source Oral, resp. rate 21, SpO2 99 %.  Risk to Self: Suicidal Ideation: No Suicidal Intent: No Is patient at risk for suicide?: No Suicidal Plan?: No Access to Means: No What has been your use of drugs/alcohol within the last 12 months?: Pt reports she drinks about a 40 a day, and smokes THC all day long.  How many times?: 0 Other Self Harm Risks: none Triggers for Past Attempts: None known Intentional Self Injurious Behavior: None Risk to Others: Homicidal Ideation: No-Not Currently/Within Last 6 Months Thoughts of Harm to Others: No-Not Currently Present/Within Last 6 Months Current Homicidal Intent: No Current Homicidal Plan: No Access to Homicidal Means: No Identified Victim: none History of harm to others?: No Assessment of Violence: None Noted Violent Behavior Description: none Does patient have access to weapons?: No Criminal Charges Pending?: No Does patient have a court date: No Prior Inpatient Therapy: Prior Inpatient Therapy: No Prior Therapy Dates: NA Prior Therapy Facilty/Provider(s): NA Reason for Treatment: NA Prior Outpatient Therapy: Prior Outpatient Therapy: No Prior Therapy Dates: NA Prior Therapy Facilty/Provider(s): NA Reason for Treatment: NA  Current Facility-Administered Medications  Medication Dose Route Frequency Provider Last Rate Last Dose  . 0.9 %  sodium chloride infusion   Intravenous Continuous Vanetta Mulders, MD      . acetaminophen (TYLENOL) tablet 650 mg  650 mg Oral Q4H PRN Mercedes Strupp Camprubi-Soms, PA-C      . alum & mag hydroxide-simeth (MAALOX/MYLANTA) 200-200-20 MG/5ML suspension 30 mL  30 mL Oral PRN Mercedes Strupp Camprubi-Soms, PA-C      . HYDROcodone-acetaminophen (NORCO/VICODIN) 5-325 MG per tablet 2 tablet  2 tablet Oral Q6H PRN Olivia Mackie, MD   2 tablet at 02/27/14 0940  . ibuprofen (ADVIL,MOTRIN) tablet 600 mg  600 mg Oral Q8H PRN Mercedes Strupp Camprubi-Soms, PA-C      .  LORazepam (ATIVAN) tablet 1 mg  1 mg Oral Q4H PRN Purvis Sheffield, MD   1 mg at 02/27/14 0744  . mirtazapine (REMERON) tablet 7.5 mg  7.5 mg Oral QHS Fantasia Jinkins      . OLANZapine zydis (ZYPREXA) disintegrating tablet 5 mg  5 mg Oral BID Earney Navy, NP   5 mg at 02/27/14 0744  . ondansetron (ZOFRAN) injection 4 mg  4 mg Intravenous Once Vanetta Mulders, MD   4 mg at 02/26/14 2106  . ondansetron (ZOFRAN) tablet 4 mg  4 mg Oral Q8H PRN Mercedes Strupp Camprubi-Soms, PA-C       Current Outpatient Prescriptions  Medication Sig Dispense Refill  . HYDROcodone-acetaminophen (NORCO/VICODIN) 5-325 MG per tablet Take 1 tablet by mouth every 4 (four) hours as needed. (Patient not taking: Reported on 02/25/2014) 15 tablet 0    Musculoskeletal: Strength & Muscle Tone: within normal limits Gait &  Station: normal Patient leans: N/A  Psychiatric Specialty Exam:     Blood pressure 112/64, pulse 100, temperature 98 F (36.7 C), temperature source Oral, resp. rate 21, SpO2 99 %.There is no height or weight on file to calculate BMI.  General Appearance: Casual and Disheveled  Eye Contact::  None  Speech:  Blocked and Pressured  Volume:  Normal  Mood:  Anxious, Depressed and Irritable  Affect:  Blunt, Congruent, Constricted and Depressed  Thought Process:  Disorganized  Orientation:  Full (Time, Place, and Person)  Thought Content:  Possible hallucination, behaving as if she is responding to internal stimuli.  Suicidal Thoughts:  No  Homicidal Thoughts:  No  Memory:  Immediate;   Fair Recent;   Poor Remote;   Poor  Judgement:  Impaired  Insight:  Shallow  Psychomotor Activity:  Psychomotor Retardation  Concentration:  Poor  Recall:  NA  Fund of Knowledge:Poor  Language: Fair  Akathisia:  NA  Handed:  Right  AIMS (if indicated):     Assets:  Desire for Improvement  ADL's:  Impaired  Cognition: Impaired,  Severe  Sleep:      Medical Decision Making: Established Problem,  Worsening (2), Review of Medication Regimen & Side Effects (2) and Review of New Medication or Change in Dosage (2)  Treatment Plan Summary: Daily contact with patient to assess and evaluate symptoms and progress in treatment, Medication management and Plan Accepted for admission, waiting for available bed   Plan:  Recommend psychiatric Inpatient admission when medically cleared. Disposition: Admission  Thedore MinsAkintayo, Zalma Channing, MD 02/27/2014 11:07 AM

## 2014-02-27 NOTE — ED Notes (Addendum)
Pt unable to get adequate rest throughout the night. Pt had multiple moments where she would rush to get out of bed but was easily redirected back to bed. Pt had splint applied to arm and reported some pain which she was medicated for. Earlier in the morning pt was given ativan 1mg  because she felt as if she had a lot on her mind and very anxious. Meds did not prove to help. Pt did state she wanted to go home and it was explained that she needed to have help so that when it was time for her to go home that she would be able to care for herself. Pt did question why her face was sore and explained to pt that she had a fall today. Pt did get tearful from just the constant interruptions from trying to rest and not being comfortable. Family reported that they would be back this morning to visit pt. Pt needs sitter for safety. Recommend that pt may need something to help her rest.

## 2014-02-27 NOTE — ED Notes (Signed)
Pt belongings placed in locker 28. Inventory sheet completed.

## 2014-02-27 NOTE — BH Assessment (Signed)
[facility] per Wende CreaseJane Russom, RN. Pt has been accepted by Odis HollingsheadJane Russom,RN  )[facility] team. Nurse to nurse reported can be called to [phone]. Dr.Pfeiffer has [facility].

## 2014-02-27 NOTE — Progress Notes (Signed)
CSW spoke with Ms. Young regarding visiting patient. Pt mother, Ms Maple HudsonYoung stated they are on their way from [place]. Patient parents were advised that psychiatrist begins rounds around 9am. Patient parents had agreed to be present in the am so they could speak with doctor.   Byrd HesselbachKristen Gareth Fitzner, LCSW [phone]  ED CSW 02/27/2014 9:47 AM

## 2014-02-27 NOTE — Progress Notes (Addendum)
CSW received notification from Erskine SquibbJane of [facility]. Romilda JoyJanes has requested that vitals, MAR, and IVC paperwork be faxed to her at [phone].  CS[facility].  Trish MageBrittney Sebert Stollings, LCSWA [phone] ED CSW 02/27/2014 9:13 PM

## 2014-02-27 NOTE — ED Provider Notes (Signed)
Dr Turner Danielsowan recommends follow up with him in office on Tuesday.  If pulling at splint can have short arm cast applied by ortho tech in 24 hours.  Linda Mackielga M Yurem Viner, MD 02/27/14 [phone]

## 2014-02-27 NOTE — BH Assessment (Signed)
[facility] Assessment Progress Note  At 11:32 Jun[facility]Connie [facility].  Pt has been placed on their wait list.  She is under priority status.  Doylene Canninghomas [place], MA Triage Specialist 02/27/2014 @ 11:38
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
## 2014-02-28 NOTE — ED Notes (Signed)
Pt pacing back and forth in room. Pt is calm and cooperative at this time. She is asking about her mother. She was informed that her mother is on her way and is estimated to arrive here at 1030.

## 2014-02-28 NOTE — ED Notes (Addendum)
Mother at to bedside

## 2014-02-28 NOTE — ED Notes (Signed)
Pt ambulated to restroom with sitter. 

## 2014-02-28 NOTE — ED Notes (Addendum)
[facility] at bedside.   Patient belongings consist of $1.65, two pieces of gum, and black and white Nike sandals. Pt reports she same with more however, this patient was naked upon EMS arrival. Belongings [facility] with [facility].

## 2014-02-28 NOTE — ED Notes (Addendum)
Friend at bedside.Pt calm and cooperative. All have been updated on [facility].   Friend laying in bed with patient. She was asked to sit in chair not lay in bed with patient.

## 2014-02-28 NOTE — ED Notes (Signed)
Coloring pages provided to occupy patient. Pt currently coloring.

## 2014-02-28 NOTE — ED Notes (Signed)
Sitter at bedside with patient.

## 2014-02-28 NOTE — ED Notes (Signed)
Pt pacing back and forth and asked repeatedly to return to room.

## 2014-02-28 NOTE — ED Notes (Signed)
Attempted to call report [facility]

## 2014-02-28 NOTE — ED Notes (Signed)
Pt now sitting in doorway of room 28. Sitter remains at bedside. Pt in view of nurses station.

## 2014-02-28 NOTE — Progress Notes (Signed)
CSW spoke with pt parents to inform them that [facility]. Patient is still attempting to escape from the ED department. Pt parents are wanting to come and see patient before she leaves. CSW informed pt family that sheriff is scheduled for 1130 per ed Diplomatic Services operational officersecretary. Pt family expressing concerns, and confusion about patient not being able to go to a local hospital. CSW provided supportive counseling. Due to patient aggression and attempting to leave the hosptial patient has been declined from several local hospital and/or there is not an acute bed available. Patient parents expressed understanding.  Byrd HesselbachKristen Jamee Keach, LCSW [phone]  ED CSW 02/28/2014 9:40 AM

## 2014-02-28 NOTE — ED Notes (Signed)
1 bag belongings given to [place] RN

## 2014-02-28 NOTE — ED Notes (Signed)
Pt provided applesauce per request.

## 2014-02-28 NOTE — ED Notes (Signed)
Emergency planning/management officerheriff officer to transport patient around [employer]1130 [facility]

## 2014-02-28 NOTE — ED Notes (Signed)
Pt continue to pace back and forth and talking with other pt stating, "you know how I am!"

## 2014-02-28 NOTE — ED Notes (Signed)
Patient's girlfriend called and requested to speak with patient. This RN requested no phone calls at this time due to patient behabvior. The Girlfriend became agitated when told no calls and states "I'm on my way up there".

## 2014-02-28 NOTE — ED Notes (Signed)
Patient spoke with mother via telephone

## 2014-02-28 NOTE — ED Notes (Signed)
Food and fluids offered however patient declines offer. Pt was asked to returned to her room and she states "I can't sit down". Will continue to monitor patient.Sitter remains with patient

## 2014-02-28 NOTE — ED Notes (Signed)
Patient is awake, noted increased anxiety/ agitation regarding the rules. Patient feels frustrated about not having control of the situation. States " I feel like I am on a ship, and I have no control"  Ambulating in hall way, no aggressive behavior noted at this time. .Marland Kitchen

## 2014-02-28 NOTE — ED Notes (Signed)
Report given to Am[place] [facility] at [facility], He was informed of patients behavior (flighting and can be aggressive)

## 2014-02-28 NOTE — ED Notes (Signed)
Awake at this time, pacing in the hallway, states " I need something to occupy my mind. Patient also requested a  cigarette, explained hospital policy regarding tobacco products.

## 2014-02-28 NOTE — ED Notes (Signed)
Bed: WA12 Expected date:  Expected time:  Means of arrival:  Comments: hold 

## 2014-02-28 NOTE — ED Notes (Signed)
Pt provided breakfast.

## 2014-02-28 NOTE — ED Notes (Signed)
Pt ambulated with Sheriff. Family Following behind [facility].

## 2014-02-28 NOTE — ED Notes (Signed)
Pt ran from room and down the hallway. Security called and patient easily directed back to room. Pt was advised not to run from room. Safety issues discussed with patient. She nods her head when this RN is talking to her. Pt calm and cooperative.

## 2015-11-17 ENCOUNTER — Encounter (HOSPITAL_COMMUNITY): Payer: Self-pay

## 2015-11-17 ENCOUNTER — Emergency Department (HOSPITAL_COMMUNITY)
Admission: EM | Admit: 2015-11-17 | Discharge: 2015-11-17 | Disposition: A | Discharge status: Home / Self Care | Payer: Self-pay | Attending: Emergency Medicine | Admitting: Emergency Medicine

## 2015-11-17 DIAGNOSIS — Z5321 Procedure and treatment not carried out due to patient leaving prior to being seen by health care provider: Secondary | ICD-10-CM | POA: Insufficient documentation

## 2015-11-17 DIAGNOSIS — F17290 Nicotine dependence, other tobacco product, uncomplicated: Secondary | ICD-10-CM | POA: Insufficient documentation

## 2015-11-17 DIAGNOSIS — Z046 Encounter for general psychiatric examination, requested by authority: Secondary | ICD-10-CM | POA: Insufficient documentation

## 2015-11-17 NOTE — ED Triage Notes (Signed)
PT has returned to triage at this time.

## 2015-11-17 NOTE — ED Notes (Signed)
Pt told phlebotomy she did not want to be stuck and stormed out of triage.

## 2015-11-17 NOTE — ED Triage Notes (Addendum)
Pt reports she does not feel right mentally. She reports she is angry. Pt is tearful in triage. Pt very reserved and has little to say. When pt asked if she has SI/HI she did not respond and stared blankly at this RN.

## 2017-04-01 ENCOUNTER — Emergency Department (HOSPITAL_COMMUNITY): Payer: Self-pay

## 2017-04-01 ENCOUNTER — Emergency Department (HOSPITAL_COMMUNITY)
Admission: EM | Admit: 2017-04-01 | Discharge: 2017-04-03 | Disposition: A | Discharge status: Other Acute Inpatient Hospital | Payer: Self-pay | Attending: Emergency Medicine | Admitting: Emergency Medicine

## 2017-04-01 ENCOUNTER — Encounter (HOSPITAL_COMMUNITY): Payer: Self-pay | Admitting: Emergency Medicine

## 2017-04-01 DIAGNOSIS — Z79899 Other long term (current) drug therapy: Secondary | ICD-10-CM | POA: Insufficient documentation

## 2017-04-01 DIAGNOSIS — F23 Brief psychotic disorder: Secondary | ICD-10-CM | POA: Insufficient documentation

## 2017-04-01 DIAGNOSIS — F3164 Bipolar disorder, current episode mixed, severe, with psychotic features: Secondary | ICD-10-CM | POA: Diagnosis present

## 2017-04-01 DIAGNOSIS — F17290 Nicotine dependence, other tobacco product, uncomplicated: Secondary | ICD-10-CM | POA: Insufficient documentation

## 2017-04-01 DIAGNOSIS — Y939 Activity, unspecified: Secondary | ICD-10-CM | POA: Insufficient documentation

## 2017-04-01 DIAGNOSIS — F4310 Post-traumatic stress disorder, unspecified: Secondary | ICD-10-CM | POA: Diagnosis present

## 2017-04-01 DIAGNOSIS — Y929 Unspecified place or not applicable: Secondary | ICD-10-CM | POA: Insufficient documentation

## 2017-04-01 DIAGNOSIS — R45851 Suicidal ideations: Secondary | ICD-10-CM | POA: Insufficient documentation

## 2017-04-01 DIAGNOSIS — S50812A Abrasion of left forearm, initial encounter: Secondary | ICD-10-CM | POA: Insufficient documentation

## 2017-04-01 DIAGNOSIS — Y999 Unspecified external cause status: Secondary | ICD-10-CM | POA: Insufficient documentation

## 2017-04-01 LAB — CBC WITH DIFFERENTIAL/PLATELET
Basophils Absolute: 0 10*3/uL (ref 0.0–0.1)
Basophils Relative: 0 %
Eosinophils Absolute: 0 10*3/uL (ref 0.0–0.7)
Eosinophils Relative: 0 %
HCT: 41.3 % (ref 36.0–46.0)
Hemoglobin: 14.1 g/dL (ref 12.0–15.0)
Lymphocytes Relative: 10 %
Lymphs Abs: 1.2 10*3/uL (ref 0.7–4.0)
MCH: 31.5 pg (ref 26.0–34.0)
MCHC: 34.1 g/dL (ref 30.0–36.0)
MCV: 92.4 fL (ref 78.0–100.0)
Monocytes Absolute: 0.6 10*3/uL (ref 0.1–1.0)
Monocytes Relative: 5 %
Neutro Abs: 10.6 10*3/uL — ABNORMAL HIGH (ref 1.7–7.7)
Neutrophils Relative %: 85 %
Platelets: 307 10*3/uL (ref 150–400)
RBC: 4.47 MIL/uL (ref 3.87–5.11)
RDW: 14.1 % (ref 11.5–15.5)
WBC: 12.5 10*3/uL — ABNORMAL HIGH (ref 4.0–10.5)

## 2017-04-01 LAB — ETHANOL: Alcohol, Ethyl (B): <10 mg/dL (ref <10)

## 2017-04-01 LAB — RAPID URINE DRUG SCREEN, HOSP PERFORMED
Amphetamines: NOT DETECTED
Barbiturates: NOT DETECTED
Benzodiazepines: NOT DETECTED
Cocaine: NOT DETECTED
Opiates: NOT DETECTED
Tetrahydrocannabinol: POSITIVE — AB

## 2017-04-01 LAB — COMPREHENSIVE METABOLIC PANEL
ALT: 34 U/L (ref 14–54)
AST: 40 U/L (ref 15–41)
Albumin: 4.7 g/dL (ref 3.5–5.0)
Alkaline Phosphatase: 76 U/L (ref 38–126)
Anion gap: 12 (ref 5–15)
BUN: 8 mg/dL (ref 6–20)
CHLORIDE: 107 mmol/L (ref 101–111)
CO2: 23 mmol/L (ref 22–32)
Calcium: 9.8 mg/dL (ref 8.9–10.3)
Creatinine, Ser: 0.91 mg/dL (ref 0.44–1.00)
GFR calc Af Amer: >60 mL/min (ref >60)
GFR calc non Af Amer: >60 mL/min (ref >60)
Glucose, Bld: 92 mg/dL (ref 65–99)
POTASSIUM: 3.6 mmol/L (ref 3.5–5.1)
SODIUM: 142 mmol/L (ref 135–145)
Total Bilirubin: 0.6 mg/dL (ref 0.3–1.2)
Total Protein: 8 g/dL (ref 6.5–8.1)

## 2017-04-01 MED ORDER — QUETIAPINE FUMARATE 300 MG PO TABS
300.0000 mg | ORAL_TABLET | Freq: Every day | ORAL | Status: DC
  Administered 2017-04-01: 300 mg via ORAL
  Filled 2017-04-01: qty 1

## 2017-04-01 MED ORDER — ZIPRASIDONE MESYLATE 20 MG IM SOLR
20.0000 mg | Freq: Once | INTRAMUSCULAR | Status: AC
  Administered 2017-04-01: 20 mg via INTRAMUSCULAR
  Filled 2017-04-01: qty 20

## 2017-04-01 MED ORDER — OLANZAPINE 5 MG PO TBDP: 5.0000 mg | ORAL_TABLET | Freq: Two times a day (BID) | ORAL | Status: DC | PRN

## 2017-04-01 MED ORDER — NICOTINE 21 MG/24HR TD PT24: 21.0000 mg | MEDICATED_PATCH | Freq: Every day | TRANSDERMAL | Status: DC

## 2017-04-01 MED ORDER — ACETAMINOPHEN 325 MG PO TABS: 650.0000 mg | ORAL_TABLET | ORAL | Status: DC | PRN

## 2017-04-01 MED ORDER — ONDANSETRON HCL 4 MG PO TABS: 4.0000 mg | ORAL_TABLET | Freq: Three times a day (TID) | ORAL | Status: DC | PRN

## 2017-04-01 MED ORDER — ALUM & MAG HYDROXIDE-SIMETH 200-200-20 MG/5ML PO SUSP: 30.0000 mL | Freq: Four times a day (QID) | ORAL | Status: DC | PRN

## 2017-04-01 MED ORDER — LORAZEPAM 1 MG PO TABS: 1.0000 mg | ORAL_TABLET | ORAL | Status: DC | PRN

## 2017-04-01 MED ORDER — STERILE WATER FOR INJECTION IJ SOLN
INTRAMUSCULAR | Status: AC
  Administered 2017-04-01: 1.2 mL
  Filled 2017-04-01: qty 10
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
## 2017-04-01 NOTE — Progress Notes (Signed)
Pt in the process of being transferred from [facility] to [facility]. TTS to complete assessment when transfer is complete.   Linda Small, MSW, LCSW Therapeutic Triage Specialist  [phone]

## 2017-04-01 NOTE — ED Notes (Signed)
Patient admitted on unit. Appear tired and sleepy. Patient denies pain. States "I am just tired. I need to rest". Endorses SI/HI but contracts for safety. Patient stated "yes I feel safe here and I will not hurt myself or do something stupid here". Patient accepted drinks offered. Requested for extra blanket and sleeps off. Will continue to monitor patient.

## 2017-04-01 NOTE — ED Notes (Signed)
[facility]-patient changed into scrubs and has been wanded

## 2017-04-01 NOTE — Progress Notes (Signed)
Nira ConnJason Berry, NP recommends inpt treatment. EDP Pricilla LovelessGoldston, Scott, MD and pt's nurse Lowella BandyNikki, RN have been advised of the disposition.   Princess BruinsAquicha Elita Dame, MSW, LCSW Therapeutic Triage Specialist  [phone]

## 2017-04-02 DIAGNOSIS — Z79899 Other long term (current) drug therapy: Secondary | ICD-10-CM

## 2017-04-02 DIAGNOSIS — F3164 Bipolar disorder, current episode mixed, severe, with psychotic features: Secondary | ICD-10-CM | POA: Diagnosis present

## 2017-04-02 DIAGNOSIS — F17210 Nicotine dependence, cigarettes, uncomplicated: Secondary | ICD-10-CM

## 2017-04-02 MED ORDER — CARBAMAZEPINE ER 200 MG PO TB12
200.0000 mg | ORAL_TABLET | Freq: Two times a day (BID) | ORAL | Status: DC
  Filled 2017-04-02: qty 1

## 2017-04-02 MED ORDER — LORAZEPAM 2 MG/ML IJ SOLN
2.0000 mg | Freq: Once | INTRAMUSCULAR | Status: AC
  Administered 2017-04-02: 2 mg via INTRAMUSCULAR
  Filled 2017-04-02: qty 1

## 2017-04-02 MED ORDER — QUETIAPINE FUMARATE 300 MG PO TABS
300.0000 mg | ORAL_TABLET | Freq: Once | ORAL | Status: AC
  Administered 2017-04-02: 300 mg via ORAL
  Filled 2017-04-02: qty 1

## 2017-04-02 MED ORDER — QUETIAPINE FUMARATE 50 MG PO TABS: 50.0000 mg | ORAL_TABLET | Freq: Once | ORAL | Status: DC

## 2017-04-02 MED ORDER — DIPHENHYDRAMINE HCL 50 MG/ML IJ SOLN
50.0000 mg | Freq: Once | INTRAMUSCULAR | Status: AC
Start: 2017-04-02 — End: 2017-04-02
  Administered 2017-04-02: 50 mg via INTRAMUSCULAR
  Filled 2017-04-02: qty 1

## 2017-04-02 MED ORDER — ZIPRASIDONE MESYLATE 20 MG IM SOLR
20.0000 mg | Freq: Once | INTRAMUSCULAR | Status: AC
  Administered 2017-04-02: 20 mg via INTRAMUSCULAR
  Filled 2017-04-02: qty 20

## 2017-04-02 MED ORDER — ASENAPINE MALEATE 5 MG SL SUBL
10.0000 mg | SUBLINGUAL_TABLET | Freq: Two times a day (BID) | SUBLINGUAL | Status: DC
  Filled 2017-04-02: qty 2

## 2017-04-02 NOTE — ED Notes (Signed)
Patient became agitated when this writer presents her bedtime med (sapphris and Tegretol). Patient jumped out of bed yelling and cursing asking why we want to force her med that she doesn't take. Patient stated she takes Seroquel and not sapphris.  Dr. Erma HeritageIsaacs notified of patient's concern.  Patient demanded to see the Dr to clarify her why her med was changed without her permission claiming no Dr saw her today.  Dr. Erma HeritageIsaacs notified - to come see the patient as soon as he can.

## 2017-04-02 NOTE — ED Notes (Signed)
Pt behavior escalating, becoming increasingly agitated, verbally aggressive, verbally threatening nursing staff, intrusive. Pt speech rapid, pressured. Behavior not redirectable. This nurse notified Jameson,NP awaiting orders.
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
## 2017-04-02 NOTE — BH Assessment (Signed)
Patient has been accepted to [facility].  Patient assigned to room 2566 Accepting physician is Dr. Celine MansVincent Izidiuno.  Call report to [phone].  [place].   RNs Darel HongJudy and Clair Gullingicki have been made aware.  Pt can arrive as soon as possible
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
## 2017-04-02 NOTE — BH Assessment (Signed)
[facility]  Per Juanetta BeetsJacqueline Norman, DO, this pt requires psychiatric hospitalization at this time.  Pt presents under IVC initiated by EDP Pricilla LovelessScott Goldston, MD.  The following facilities have been contacted to seek placement for this pt, with results as noted:  Beds available, information sent, decision pending:  [facility]   At capacity:  [facility]   Parveen Freehling, [place] Behavioral Health Coordinator [phone]

## 2017-04-02 NOTE — BHH Counselor (Signed)
Patient has been accepted to [facility].  Patient assigned to room 2566 Accepting physician is Dr. Vincent Izidiuno.  Call report to [phone].  Representative was Barbara.   RNs Judy and Nicki have been made aware.  Pt can arrive as soon as possible   

## 2017-04-02 NOTE — ED Provider Notes (Addendum)
Patient agitated, aggressive in the acute care area.  I had a long discussion with the patient.  She appears to be manic.  She is refusing to take the Saphris as she states that no psychiatrist discussed with her.  I am unable to convince her to take the medication.  She does appear to be exhibiting signs of persistent mania with pressured speech, aggression, combativeness toward myself and nursing. Pt was previously on Seroquel, is refusing to take anything but Seroquel at this time. I do think she would benefit from antipsychotic/mood stabilizer at this time. Given that she is amenable to Seroquel, will give dose tonight to avoid IM meds, and advise Psych team to discuss med changes with her in the morning. Pt was given Seroquel 300 last night, tolerated well per RN. She is on this dose at home.  I've ordered Saphris to re-start tomorrow AM, with no dose tonight.   Shaune PollackIsaacs, Tin Engram, MD 04/02/17 [id number]    Shaune PollackIsaacs, Maheen Cwikla, MD 04/02/17 2238

## 2017-04-02 NOTE — ED Notes (Signed)
Per Mellody Lifeobin Charge Nurse in [facility], to call report as soon patient is about to be transported.

## 2017-04-02 NOTE — Progress Notes (Signed)
CS[facility] asking about [facility] need for [facility].  [facility] stated thet will review and call back.  CSW will continue to follow on 3/11, for D/C needs.  Dorothe PeaJonathan F. Emogene Muratalla, LCSW, LCAS, CSI Clinical Social Worker Ph: [phone]

## 2017-04-03 MED ORDER — LORAZEPAM 2 MG/ML IJ SOLN
2.0000 mg | Freq: Once | INTRAMUSCULAR | Status: AC
  Administered 2017-04-03: 2 mg via INTRAMUSCULAR
  Filled 2017-04-03: qty 1

## 2017-04-03 NOTE — ED Notes (Signed)
Sheriff called to say they would be picking patient up around 10:00 this morning.

## 2017-04-03 NOTE — ED Notes (Signed)
Report called to Mudloggerascale RN at [facility].

## 2017-04-03 NOTE — ED Notes (Signed)
Patient back in room feeling calmer now.

## 2017-04-03 NOTE — ED Notes (Signed)
After medication given, patient sitting on shelf beside telephone talking loudly to staff with pressured speech and flight of ideas.

## 2017-04-03 NOTE — ED Notes (Signed)
Patient very agitated.  Standing at nurse's station yelling at staff.  [facility].  EDP notified for medication orders.  Ativan 2mg  ordered by Dr. Jeraldine LootsLockwood.
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
## 2018-10-29 ENCOUNTER — Ambulatory Visit (HOSPITAL_COMMUNITY)
Admission: EM | Admit: 2018-10-29 | Discharge: 2018-10-29 | Disposition: A | Discharge status: Home / Self Care | Payer: Self-pay

## 2018-10-29 ENCOUNTER — Encounter (HOSPITAL_COMMUNITY): Payer: Self-pay

## 2018-10-29 DIAGNOSIS — Z202 Contact with and (suspected) exposure to infections with a predominantly sexual mode of transmission: Secondary | ICD-10-CM | POA: Insufficient documentation

## 2018-10-29 DIAGNOSIS — M79644 Pain in right finger(s): Secondary | ICD-10-CM | POA: Insufficient documentation

## 2018-10-29 MED ORDER — METRONIDAZOLE 500 MG PO TABS
ORAL_TABLET | ORAL | Status: AC
  Filled 2018-10-29: qty 4

## 2018-10-29 MED ORDER — METRONIDAZOLE 500 MG PO TABS
2000.0000 mg | ORAL_TABLET | Freq: Once | ORAL | Status: AC
  Administered 2018-10-29: 12:00:00 2000 mg via ORAL

## 2018-10-29 NOTE — ED Provider Notes (Signed)
[facility]    CSN: [id number] Arrival date & time: 10/29/18  1106      History   Chief Complaint Chief Complaint  Patient presents with  . Finger Injury  . Exposure to STD    HPI Linda Small is a 38 y.o. female.   Patient is a 38 year old female past medical history of bipolar, PTSD.  She presents today with approximately 1 month of right thumb discomfort.  This is worse waking up the morning.  Reporting intermittent dislocations of the thumb and putting back in place.  She has been taking Aleve for the pain with some Aleve.  Denies any numbness, tingling or weakness.  This started when she was at the gym in lifted very heavy weight over her head.  Currently the range of motion is good in her thumb.  She would also like to be tested for STDs.  Reports possible exposure to trichomonas.  She is not currently having any symptoms.  No abdominal pain, back pain, fever, vaginal discharge, itching or irritation.  ROS per HPI      Past Medical History:  Diagnosis Date  . Bipolar 1 disorder ([place])   . PTSD (post-traumatic stress disorder)     Patient Active Problem List   Diagnosis Date Noted  . Bipolar affective disorder, mixed, severe, with psychotic behavior ([place]) 04/02/2017  . Psychosis ([place]) 02/26/2014  . PTSD (post-traumatic stress disorder)     Past Surgical History:  Procedure Laterality Date  . ABDOMINAL HYSTERECTOMY    . HAND SURGERY      OB History   No obstetric history on file.      Home Medications    Prior to Admission medications   Medication Sig Start Date End Date Taking? Authorizing Provider  Divalproex Sodium (DEPAKOTE PO) Take 1 tablet by mouth daily.    [provider]  prazosin (MINIPRESS) 1 MG capsule  09/09/18   [provider]  QUEtiapine (SEROQUEL) 200 MG tablet  09/09/18   [provider]    Family History History reviewed. No pertinent family history.  Social History Social History   Tobacco  Use  . Smoking status: Current Every Day Smoker    Types: Cigars  . Smokeless tobacco: Never Used  . Tobacco comment: one black and mild a day  Substance Use Topics  . Alcohol use: Yes    Comment: occasionally  . Drug use: Yes    Types: Marijuana     Allergies   Other   Review of Systems Review of Systems   Physical Exam Triage Vital Signs ED Triage Vitals  Enc Vitals Group     BP 10/29/18 1132 (!) 133/92     Pulse Rate 10/29/18 1132 (!) 105     Resp 10/29/18 1132 16     Temp 10/29/18 1132 98.4 F (36.9 C)     Temp Source 10/29/18 1132 Oral     SpO2 10/29/18 1132 97 %     Weight --      Height --      Head Circumference --      Peak Flow --      Pain Score 10/29/18 1127 7     Pain Loc --      Pain Edu? --      Excl. in [place]? --    No data found.  Updated Vital Signs BP (!) 133/92 (BP Location: Right Arm)   Pulse (!) 105   Temp 98.4 F (36.9 C) (Oral)  Resp 16   SpO2 97%   Visual Acuity Right Eye Distance:   Left Eye Distance:   Bilateral Distance:    Right Eye Near:   Left Eye Near:    Bilateral Near:     Physical Exam Vitals signs and nursing note reviewed.  Constitutional:      General: She is not in acute distress.    Appearance: Normal appearance. She is not ill-appearing, toxic-appearing or diaphoretic.  HENT:     Head: Normocephalic.     Nose: Nose normal.     Mouth/Throat:     Pharynx: Oropharynx is clear.  Eyes:     Conjunctiva/sclera: Conjunctivae normal.  Neck:     Musculoskeletal: Normal range of motion.  Pulmonary:     Effort: Pulmonary effort is normal.  Abdominal:     Palpations: Abdomen is soft.     Tenderness: There is no abdominal tenderness.  Musculoskeletal: Normal range of motion.     Right hand: She exhibits normal range of motion, no tenderness, no bony tenderness, normal capillary refill, no deformity, no laceration and no swelling. Normal sensation noted. Normal strength noted.       Hands:  Skin:    General:  Skin is warm and dry.     Findings: No rash.  Neurological:     Mental Status: She is alert.  Psychiatric:        Mood and Affect: Mood normal.      UC Treatments / Results  Labs (all labs ordered are listed, but only abnormal results are displayed) Labs Reviewed  CERVICOVAGINAL ANCILLARY ONLY    EKG   Radiology No results found.  Procedures Procedures (including critical care time)  Medications Ordered in UC Medications  metroNIDAZOLE (FLAGYL) tablet 2,000 mg (has no administration in time range)    Initial Impression / Assessment and Plan / UC Course  I have reviewed the triage vital signs and the nursing notes.  Pertinent labs & imaging results that were available during my care of the patient were reviewed by me and considered in my medical decision making (see chart for details).     Right thumb pain- will place in thumb spica splint for comfort,  and due to laxity,  She can wear at bedtime and at the gym for a few weeks to see if this helps. Aleve as needed for pain.  Follow up with ortho.   STD exposure- treating for trich based on exposure.  Swab sent for testing.  Final Clinical Impressions(s) / UC Diagnoses   Final diagnoses:  Thumb pain, right  Exposure to STD     Discharge Instructions     Splint placed here in the clinic to stabilize the thumb and hopefully help with pain and dislocation. If this worsens you will need to see orthopedics. Rest, use the splint and aleve as needed.   Testing for STDs ans treating for trichomoniasis today  We will call you with any positive results of your swab.  Follow up as needed for continued or worsening symptoms     ED Prescriptions    None     PDMP not reviewed this encounter.   Janace Aris, NP 10/29/18 1158

## 2018-10-29 NOTE — Discharge Instructions (Addendum)
Splint placed here in the clinic to stabilize the thumb and hopefully help with pain and dislocation. If this worsens you will need to see orthopedics. Rest, use the splint and aleve as needed.   Testing for STDs ans treating for trichomoniasis today  We will call you with any positive results of your swab.  Follow up as needed for continued or worsening symptoms

## 2018-10-29 NOTE — ED Triage Notes (Signed)
Pt report she pain in right thumb, pt injured the right thumb  at the gym  1 month ago.   Pt wants to be test fro STD, her partner have being treat it for STD.

## 2018-11-01 LAB — CERVICOVAGINAL ANCILLARY ONLY
Bacterial vaginitis: NEGATIVE
Candida vaginitis: NEGATIVE
Chlamydia: NEGATIVE
Neisseria Gonorrhea: NEGATIVE
Trichomonas: NEGATIVE

## 2019-09-28 ENCOUNTER — Ambulatory Visit (HOSPITAL_COMMUNITY)
Admission: EM | Admit: 2019-09-28 | Discharge: 2019-09-28 | Disposition: A | Discharge status: Home / Self Care | Payer: Self-pay | Attending: Emergency Medicine | Admitting: Emergency Medicine

## 2019-09-28 ENCOUNTER — Encounter (HOSPITAL_COMMUNITY): Payer: Self-pay | Admitting: Gynecology

## 2019-09-28 ENCOUNTER — Other Ambulatory Visit: Payer: Self-pay

## 2019-09-28 DIAGNOSIS — S161XXA Strain of muscle, fascia and tendon at neck level, initial encounter: Secondary | ICD-10-CM

## 2019-09-28 MED ORDER — CYCLOBENZAPRINE HCL 5 MG PO TABS: 5.0000 mg | ORAL_TABLET | Freq: Three times a day (TID) | ORAL | 0 refills | Status: AC | PRN
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
## 2019-09-28 NOTE — Discharge Instructions (Signed)
Light and regular activity as tolerated.  Stretching, heat massage to the area.  Naproxen twice a day, take with food.  Flexeril three times a day as needed. May cause drowsiness. Please do not take if driving or drinking alcohol.

## 2019-09-30 ENCOUNTER — Emergency Department (HOSPITAL_COMMUNITY)
Admission: EM | Admit: 2019-09-30 | Discharge: 2019-09-30 | Disposition: A | Discharge status: Home / Self Care | Payer: Self-pay | Attending: Emergency Medicine | Admitting: Emergency Medicine

## 2019-09-30 ENCOUNTER — Encounter (HOSPITAL_COMMUNITY): Payer: Self-pay | Admitting: Emergency Medicine

## 2019-09-30 ENCOUNTER — Other Ambulatory Visit: Payer: Self-pay

## 2019-09-30 DIAGNOSIS — Y999 Unspecified external cause status: Secondary | ICD-10-CM | POA: Insufficient documentation

## 2019-09-30 DIAGNOSIS — Y939 Activity, unspecified: Secondary | ICD-10-CM | POA: Insufficient documentation

## 2019-09-30 DIAGNOSIS — R519 Headache, unspecified: Secondary | ICD-10-CM | POA: Insufficient documentation

## 2019-09-30 DIAGNOSIS — Z5321 Procedure and treatment not carried out due to patient leaving prior to being seen by health care provider: Secondary | ICD-10-CM | POA: Insufficient documentation

## 2019-09-30 DIAGNOSIS — Y92410 Unspecified street and highway as the place of occurrence of the external cause: Secondary | ICD-10-CM | POA: Insufficient documentation
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
## 2019-09-30 NOTE — ED Notes (Signed)
Pt notified staff that she was leaving 

## 2019-09-30 NOTE — ED Notes (Signed)
Pt went to the cafeteria.

## 2020-08-03 ENCOUNTER — Other Ambulatory Visit: Payer: Self-pay

## 2020-08-03 ENCOUNTER — Encounter (HOSPITAL_COMMUNITY): Payer: Self-pay | Admitting: Emergency Medicine

## 2020-08-03 ENCOUNTER — Emergency Department (HOSPITAL_COMMUNITY)
Admission: EM | Admit: 2020-08-03 | Discharge: 2020-08-03 | Disposition: A | Discharge status: Home / Self Care | Payer: Non-veteran care | Attending: Emergency Medicine | Admitting: Emergency Medicine

## 2020-08-03 DIAGNOSIS — Z5321 Procedure and treatment not carried out due to patient leaving prior to being seen by health care provider: Secondary | ICD-10-CM | POA: Insufficient documentation

## 2020-08-03 DIAGNOSIS — R002 Palpitations: Secondary | ICD-10-CM | POA: Diagnosis present

## 2020-08-03 LAB — I-STAT CHEM 8, ED
BUN: 7 mg/dL (ref 6–20)
Calcium, Ion: 1.19 mmol/L (ref 1.15–1.40)
Chloride: 108 mmol/L (ref 98–111)
Creatinine, Ser: 0.9 mg/dL (ref 0.44–1.00)
Glucose, Bld: 118 mg/dL — ABNORMAL HIGH (ref 70–99)
HCT: 39 % (ref 36.0–46.0)
Hemoglobin: 13.3 g/dL (ref 12.0–15.0)
Potassium: 3.5 mmol/L (ref 3.5–5.1)
Sodium: 142 mmol/L (ref 135–145)
TCO2: 23 mmol/L (ref 22–32)

## 2020-08-03 NOTE — ED Notes (Signed)
Pt left ED.

## 2020-08-03 NOTE — ED Provider Notes (Signed)
Emergency Medicine Provider Triage Evaluation Note  Linda Small , a 40 y.o. female  was evaluated in triage.  Pt complains of palpitations x 24 hours. Palpitations are sensation of heart beating faster than normal. Symptoms making it hard to sleep. No relief from Minipress or Seroquel which patient is prescribed. Hx PTSD and bipolar 1 d/o.  Review of Systems  Positive: Palpitations  Negative: syncope  Physical Exam  BP 118/88 (BP Location: Right Arm)   Pulse 100   Temp 98.9 F (37.2 C)   Resp 18   SpO2 100%  Gen:   Awake, no distress   Resp:  Normal effort  MSK:   Moves extremities without difficulty  Other:  Heart RRR  Medical Decision Making  Medically screening exam initiated at 6:17 AM.  Appropriate orders placed.  Haig Prophet Pflieger was informed that the remainder of the evaluation will be completed by another provider, this initial triage assessment does not replace that evaluation, and the importance of remaining in the ED until their evaluation is complete.  Palpitations    Antony Madura, PA-C 08/03/20 [id number]    Shon Baton, MD 08/04/20 [phone]

## 2020-08-03 NOTE — ED Triage Notes (Signed)
Patient with history of palpitations for the last day.  She states that she is still having the palpitations, but they are not as bad as they were earlier in the evening.  No nausea or vomiting.  Patient is CAOx$.

## 2020-08-04 ENCOUNTER — Other Ambulatory Visit: Payer: Self-pay

## 2020-08-04 ENCOUNTER — Emergency Department (HOSPITAL_COMMUNITY): Payer: No Typology Code available for payment source

## 2020-08-04 ENCOUNTER — Emergency Department (HOSPITAL_COMMUNITY)
Admission: EM | Admit: 2020-08-04 | Discharge: 2020-08-04 | Discharge status: Against Medical Advice | Payer: No Typology Code available for payment source | Attending: Emergency Medicine | Admitting: Emergency Medicine

## 2020-08-04 ENCOUNTER — Encounter (HOSPITAL_COMMUNITY): Payer: Self-pay

## 2020-08-04 DIAGNOSIS — Z5321 Procedure and treatment not carried out due to patient leaving prior to being seen by health care provider: Secondary | ICD-10-CM | POA: Diagnosis not present

## 2020-08-04 DIAGNOSIS — R002 Palpitations: Secondary | ICD-10-CM | POA: Diagnosis not present

## 2020-08-04 LAB — CBC WITH DIFFERENTIAL/PLATELET
Abs Immature Granulocytes: 0.05 10*3/uL (ref 0.00–0.07)
Basophils Absolute: 0 10*3/uL (ref 0.0–0.1)
Basophils Relative: 0 %
Eosinophils Absolute: 0.1 10*3/uL (ref 0.0–0.5)
Eosinophils Relative: 1 %
HCT: 41 % (ref 36.0–46.0)
Hemoglobin: 13.6 g/dL (ref 12.0–15.0)
Immature Granulocytes: 0 %
Lymphocytes Relative: 27 %
Lymphs Abs: 3.8 10*3/uL (ref 0.7–4.0)
MCH: 30.4 pg (ref 26.0–34.0)
MCHC: 33.2 g/dL (ref 30.0–36.0)
MCV: 91.7 fL (ref 80.0–100.0)
Monocytes Absolute: 0.8 10*3/uL (ref 0.1–1.0)
Monocytes Relative: 6 %
Neutro Abs: 9.4 10*3/uL — ABNORMAL HIGH (ref 1.7–7.7)
Neutrophils Relative %: 66 %
Platelets: 397 10*3/uL (ref 150–400)
RBC: 4.47 MIL/uL (ref 3.87–5.11)
RDW: 13.2 % (ref 11.5–15.5)
WBC: 14.2 10*3/uL — ABNORMAL HIGH (ref 4.0–10.5)
nRBC: 0 % (ref 0.0–0.2)

## 2020-08-04 LAB — BASIC METABOLIC PANEL
Anion gap: 13 (ref 5–15)
BUN: 7 mg/dL (ref 6–20)
CO2: 21 mmol/L — ABNORMAL LOW (ref 22–32)
Calcium: 10.1 mg/dL (ref 8.9–10.3)
Chloride: 107 mmol/L (ref 98–111)
Creatinine, Ser: 0.95 mg/dL (ref 0.44–1.00)
GFR, Estimated: >60 mL/min (ref >60)
Glucose, Bld: 98 mg/dL (ref 70–99)
Potassium: 3.7 mmol/L (ref 3.5–5.1)
Sodium: 141 mmol/L (ref 135–145)

## 2020-08-04 LAB — TROPONIN I (HIGH SENSITIVITY)
Troponin I (High Sensitivity): 4 ng/L (ref <18)
Troponin I (High Sensitivity): 5 ng/L (ref <18)

## 2020-08-04 NOTE — ED Triage Notes (Signed)
Patient states that she had palpitations yesterday that she thinks related to her anxiety. Patient alert and oriented and no pain or palpitations now. Alert and oriented

## 2020-08-04 NOTE — ED Provider Notes (Signed)
Emergency Medicine Provider Triage Evaluation Note  Linda Small , a 40 y.o. female  was evaluated in triage.  Pt complains of patient presents with chest palpitations, started yesterday, they are intermittent, states she does not have them at this time.  She denies associated chest pain, shortness of breath, lightheadedness, dizziness, nausea vomiting pedal edema.  She has no other complaints at this time.  Has no cardiac history, no history of PEs or DVTs, denies illicit drug use..  Review of Systems  Positive: Heart palpitations Negative: Chest pain shortness of breath  Physical Exam  BP 124/87 (BP Location: Right Arm)   Pulse (!) 138   Temp 98.8 F (37.1 C) (Oral)   Resp 19   SpO2 98%  Gen:   Awake, no distress   Resp:  Normal effort  MSK:   Moves extremities without difficulty  Other:    Medical Decision Making  Medically screening exam initiated at 5:02 PM.  Appropriate orders placed.  Linda Small was informed that the remainder of the evaluation will be completed by another provider, this initial triage assessment does not replace that evaluation, and the importance of remaining in the ED until their evaluation is complete  Presents with heart palpitations, lab work and imaging have been ordered, patient need further work-up.   Linda Sage, PA-C 08/04/20 1703    Linda Kaplan, MD 08/04/20 1725

## 2020-08-04 NOTE — ED Notes (Signed)
Pt came back in, informed pt that she needs to remain in the waiting room.

## 2020-08-04 NOTE — ED Notes (Signed)
Pt called for room, no response. 

## 2020-08-04 NOTE — ED Notes (Signed)
Pt seen walking towards entrance. This NT asking if pt is leaving and she said yes and continued walking.

## 2022-01-20 IMAGING — DX DG CHEST 2V
2 series · 2 of 2 positions shown · non-contrast
Comparison: 04/01/2017

CLINICAL DATA: Palpitation.  Anxiety.

EXAM:
CHEST - 2 VIEW

[chest pa]
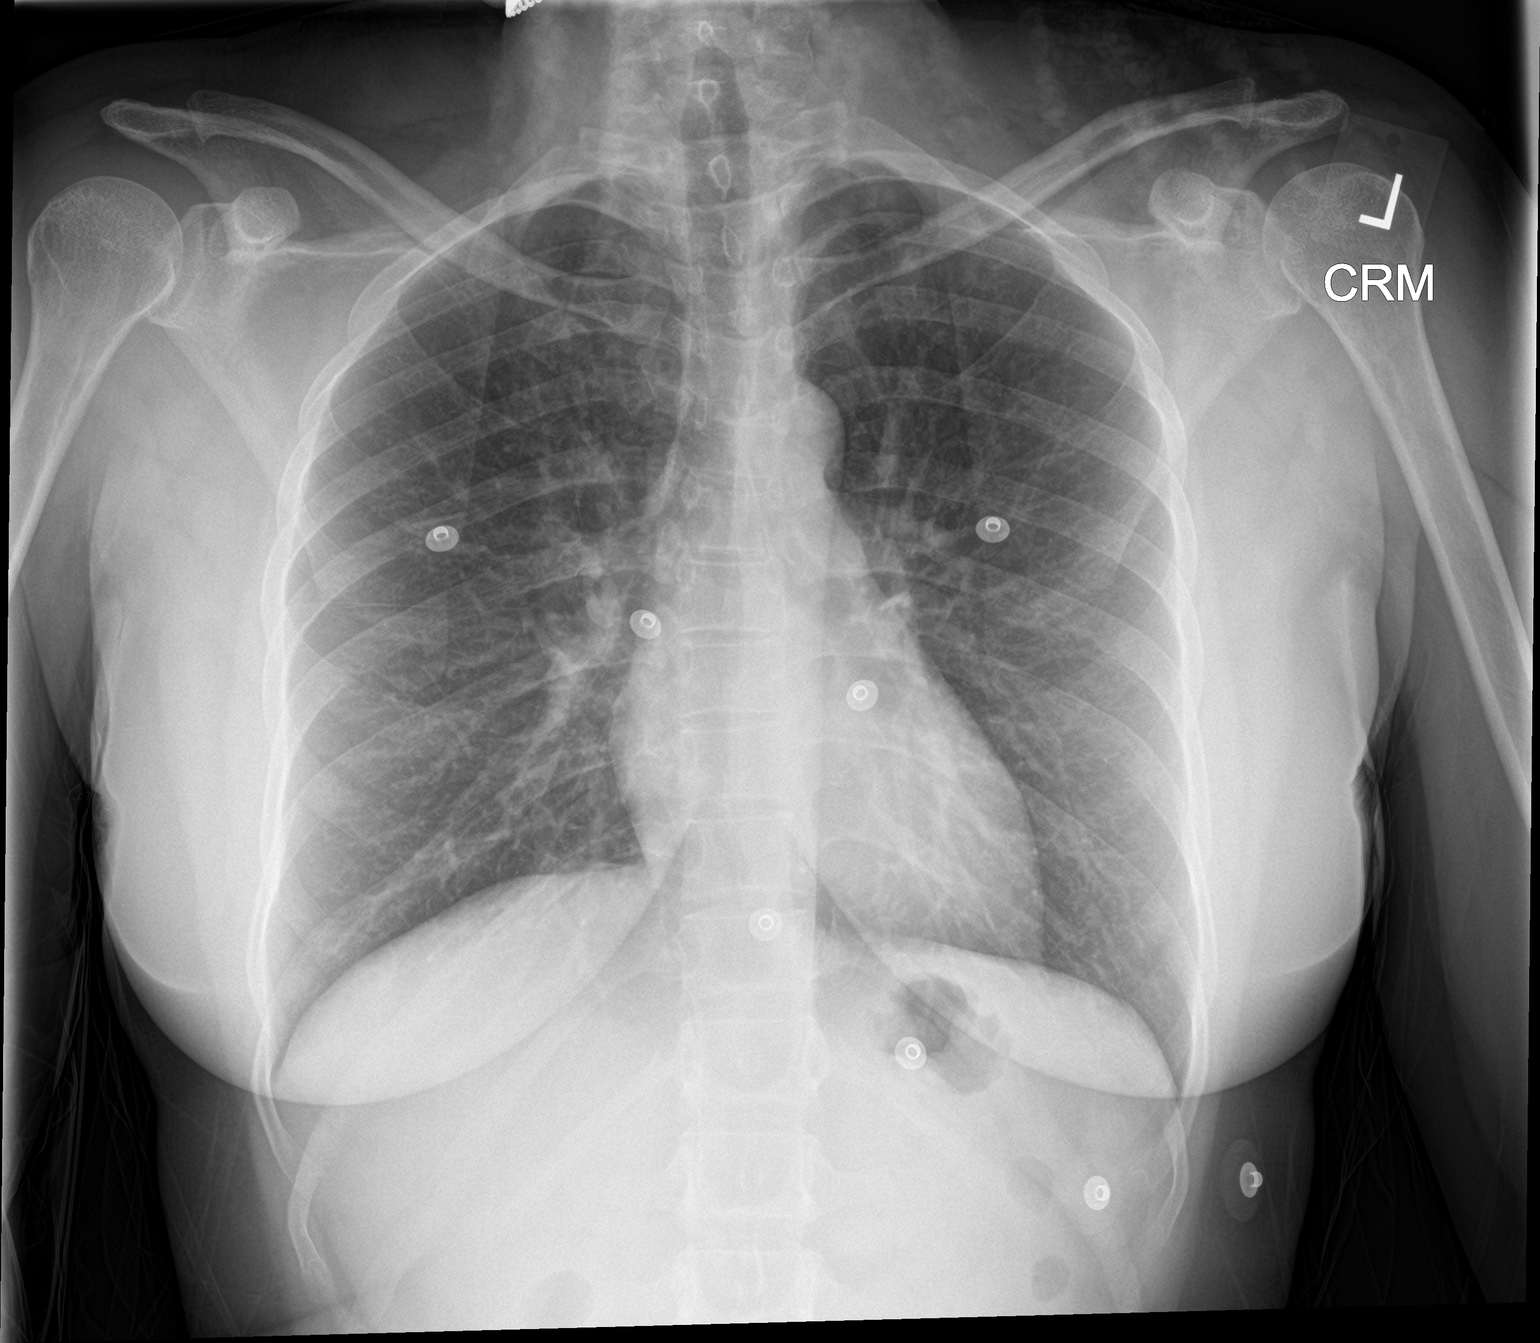

[chest lat]
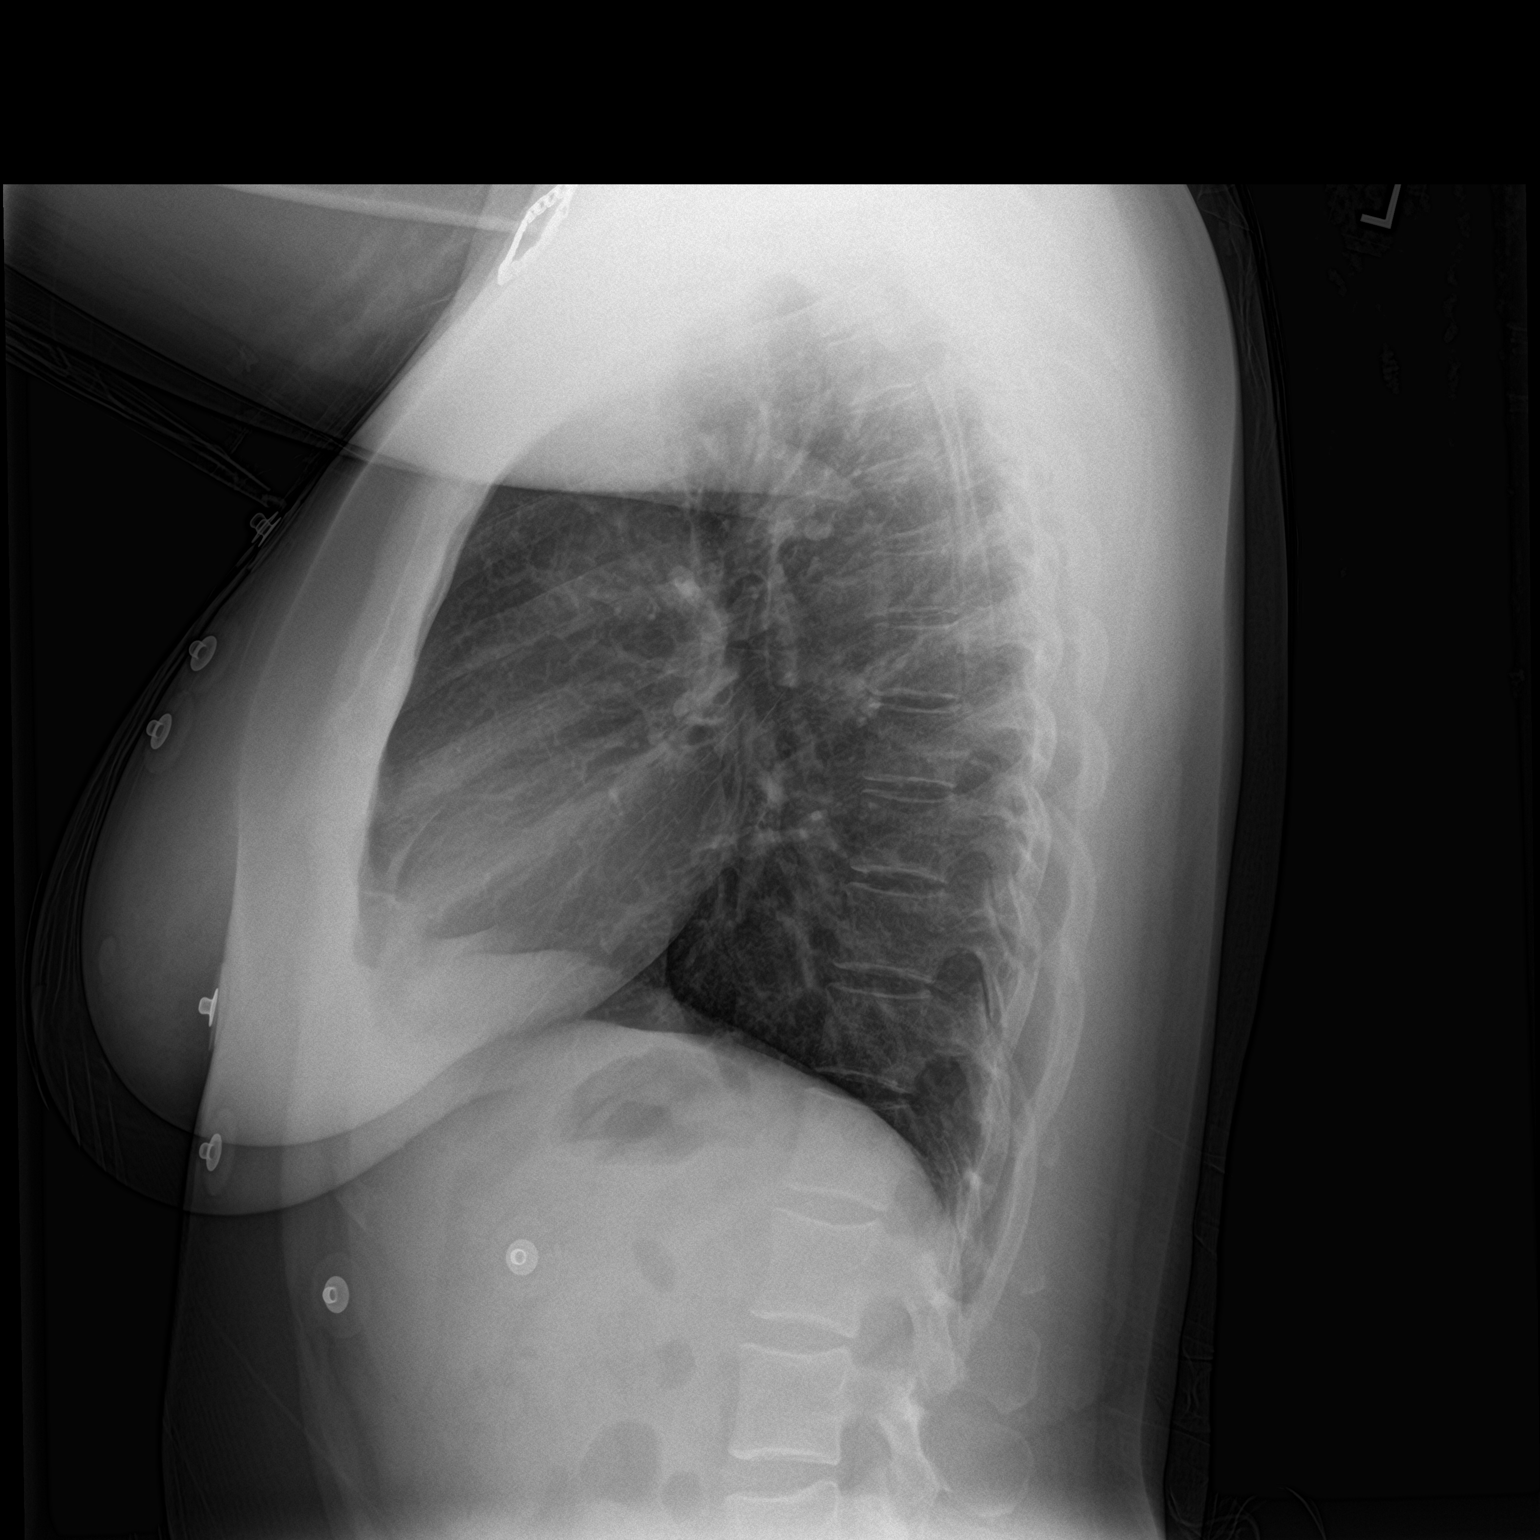

[2 of 2 positions shown; findings below may reference images not displayed]

FINDINGS: Midline trachea. Normal heart size and mediastinal contours. No
pleural effusion or pneumothorax. Clear lungs.
IMPRESSION: No acute cardiopulmonary disease.

## 2022-05-23 ENCOUNTER — Other Ambulatory Visit: Payer: Self-pay

## 2022-05-23 ENCOUNTER — Emergency Department (HOSPITAL_COMMUNITY): Payer: Non-veteran care

## 2022-05-23 ENCOUNTER — Encounter (HOSPITAL_COMMUNITY): Payer: Self-pay | Admitting: Emergency Medicine

## 2022-05-23 ENCOUNTER — Emergency Department (HOSPITAL_COMMUNITY)
Admission: EM | Admit: 2022-05-23 | Discharge: 2022-05-24 | Disposition: A | Discharge status: Home / Self Care | Payer: Non-veteran care | Attending: Emergency Medicine | Admitting: Emergency Medicine

## 2022-05-23 DIAGNOSIS — E119 Type 2 diabetes mellitus without complications: Secondary | ICD-10-CM | POA: Diagnosis not present

## 2022-05-23 DIAGNOSIS — R000 Tachycardia, unspecified: Secondary | ICD-10-CM | POA: Insufficient documentation

## 2022-05-23 DIAGNOSIS — E860 Dehydration: Secondary | ICD-10-CM | POA: Insufficient documentation

## 2022-05-23 DIAGNOSIS — Z87891 Personal history of nicotine dependence: Secondary | ICD-10-CM | POA: Insufficient documentation

## 2022-05-23 DIAGNOSIS — R002 Palpitations: Secondary | ICD-10-CM | POA: Diagnosis present

## 2022-05-23 LAB — CBC
HCT: 37.2 % (ref 36.0–46.0)
Hemoglobin: 12.8 g/dL (ref 12.0–15.0)
MCH: 30.8 pg (ref 26.0–34.0)
MCHC: 34.4 g/dL (ref 30.0–36.0)
MCV: 89.6 fL (ref 80.0–100.0)
Platelets: 291 10*3/uL (ref 150–400)
RBC: 4.15 MIL/uL (ref 3.87–5.11)
RDW: 12.9 % (ref 11.5–15.5)
WBC: 13 10*3/uL — ABNORMAL HIGH (ref 4.0–10.5)
nRBC: 0 % (ref 0.0–0.2)

## 2022-05-23 LAB — BASIC METABOLIC PANEL
Anion gap: 19 — ABNORMAL HIGH (ref 5–15)
BUN: 12 mg/dL (ref 6–20)
CO2: 15 mmol/L — ABNORMAL LOW (ref 22–32)
Calcium: 9.6 mg/dL (ref 8.9–10.3)
Chloride: 100 mmol/L (ref 98–111)
Creatinine, Ser: 1.16 mg/dL — ABNORMAL HIGH (ref 0.44–1.00)
GFR, Estimated: >60 mL/min (ref >60)
Glucose, Bld: 77 mg/dL (ref 70–99)
Potassium: 3.1 mmol/L — ABNORMAL LOW (ref 3.5–5.1)
Sodium: 134 mmol/L — ABNORMAL LOW (ref 135–145)

## 2022-05-23 LAB — TSH: TSH: 1.77 u[IU]/mL (ref 0.350–4.500)

## 2022-05-23 LAB — T4, FREE: Free T4: 1.22 ng/dL — ABNORMAL HIGH (ref 0.61–1.12)

## 2022-05-23 MED ORDER — METOPROLOL TARTRATE 5 MG/5ML IV SOLN: 10.0000 mg | Freq: Once | INTRAVENOUS | Status: DC

## 2022-05-23 MED ORDER — PROPRANOLOL HCL 10 MG PO TABS: 10.0000 mg | ORAL_TABLET | Freq: Three times a day (TID) | ORAL | 0 refills | Status: AC | PRN

## 2022-05-23 MED ORDER — LORAZEPAM 2 MG/ML IJ SOLN
1.0000 mg | Freq: Once | INTRAMUSCULAR | Status: AC
  Administered 2022-05-23: 1 mg via INTRAVENOUS
  Filled 2022-05-23: qty 1

## 2022-05-23 MED ORDER — SODIUM CHLORIDE 0.9 % IV BOLUS
1000.0000 mL | Freq: Once | INTRAVENOUS | Status: AC
  Administered 2022-05-23: 1000 mL via INTRAVENOUS

## 2022-05-23 NOTE — ED Provider Triage Note (Signed)
Emergency Medicine Provider Triage Evaluation Note  Linda Small , a 42 y.o. female  was evaluated in triage.  Pt complains of palpitations.  States that began this morning.  No inciting events.  No history of similar.  No chest pain, pressure or shortness of breath.  Also endorses hot flashes.  Admits to marijuana use but denies other recreational drug use.  Specifically denies cocaine use  Review of Systems  Positive: As above Negative: As above  Physical Exam  BP 119/68 (BP Location: Right Arm)   Pulse (!) 157   Temp 98.2 F (36.8 C) (Oral)   Resp 18   Ht 5\' 1"  (1.549 m)   Wt 57.6 kg   SpO2 95%   BMI 24.00 kg/m  Gen:   Awake, no distress   Resp:  Normal effort  MSK:   Moves extremities without difficulty  Other:    Medical Decision Making  Medically screening exam initiated at 9:00 PM.  Appropriate orders placed.  Linda Small was informed that the remainder of the evaluation will be completed by another provider, this initial triage assessment does not replace that evaluation, and the importance of remaining in the ED until their evaluation is complete.  Workup initiated   Michelle Piper, Cordelia Poche 05/23/22 2100

## 2022-05-23 NOTE — ED Triage Notes (Addendum)
Pt via POV c/o palpitations since this morning. Pt reports hot flashes and sweating. Denies CP, nausea, dizziness, vomiting. Hx heart murmur as a child, HLD, DM2, smoker. HR 157 in triage

## 2022-05-23 NOTE — ED Provider Notes (Signed)
[place] [facility] AT [facility] Provider Note   CSN: [id number] Arrival date & time: 05/23/22  2037     History  Chief Complaint  Patient presents with   Palpitations    Linda Small is a 42 y.o. female history of bipolar and schizophrenia on Depakote, diabetes, here presenting with palpitations.  Patient states that she has been having palpitations and hot flash symptoms since this morning.  Denies any chest pain or shortness of breath.  Denies any recent travel or history of blood clots.  She states that she is in menopause already.  She states that she may have a panic attack and just feels nervous.  She denies any thoughts of harming herself or others.  She was noted to be tachycardic with heart rate in the 150s in triage.  The history is provided by the patient.       Home Medications Prior to Admission medications   Medication Sig Start Date End Date Taking? Authorizing Provider  cyclobenzaprine (FLEXERIL) 5 MG tablet Take 1 tablet (5 mg total) by mouth 3 (three) times daily as needed for muscle spasms. 09/28/19   Linus Mako B, NP  Divalproex Sodium (DEPAKOTE PO) Take 1 tablet by mouth daily.    [provider]  prazosin (MINIPRESS) 1 MG capsule  09/09/18   [provider]  QUEtiapine (SEROQUEL) 200 MG tablet  09/09/18   [provider]      Allergies    Patient has no known allergies.    Review of Systems   Review of Systems  Cardiovascular:  Positive for palpitations.  All other systems reviewed and are negative.   Physical Exam Updated Vital Signs BP 115/67   Pulse (!) 116   Temp 98.2 F (36.8 C) (Oral)   Resp (!) 25   Ht 5\' 1"  (1.549 m)   Wt 57.6 kg   SpO2 96%   BMI 24.00 kg/m  Physical Exam Vitals and nursing note reviewed.  HENT:     Head: Normocephalic.     Nose: Nose normal.     Mouth/Throat:     Mouth: Mucous membranes are moist.  Eyes:     Extraocular Movements: Extraocular movements  intact.     Pupils: Pupils are equal, round, and reactive to light.  Cardiovascular:     Rate and Rhythm: Regular rhythm. Tachycardia present.     Pulses: Normal pulses.  Pulmonary:     Effort: Pulmonary effort is normal.     Breath sounds: Normal breath sounds.  Abdominal:     General: Abdomen is flat.     Palpations: Abdomen is soft.  Musculoskeletal:        General: Normal range of motion.     Cervical back: Normal range of motion and neck supple.  Skin:    General: Skin is warm.     Capillary Refill: Capillary refill takes less than 2 seconds.  Neurological:     General: No focal deficit present.     Mental Status: She is alert and oriented to person, place, and time.  Psychiatric:        Mood and Affect: Mood normal.        Behavior: Behavior normal.     ED Results / Procedures / Treatments   Labs (all labs ordered are listed, but only abnormal results are displayed) Labs Reviewed  BASIC METABOLIC PANEL - Abnormal; Notable for the following components:      Result Value   Sodium  134 (*)    Potassium 3.1 (*)    CO2 15 (*)    Creatinine, Ser 1.16 (*)    Anion gap 19 (*)    All other components within normal limits  CBC - Abnormal; Notable for the following components:   WBC 13.0 (*)    All other components within normal limits  T4, FREE - Abnormal; Notable for the following components:   Free T4 1.22 (*)    All other components within normal limits  TSH  RAPID URINE DRUG SCREEN, HOSP PERFORMED    EKG EKG Interpretation  Date/Time:  Tuesday May 23 2022 22:24:47 EDT Ventricular Rate:  119 PR Interval:  128 QRS Duration: 71 QT Interval:  290 QTC Calculation: 408 R Axis:   82 Text Interpretation: Sinus tachycardia Atrial premature complex Borderline T wave abnormalities rate slower than previous Confirmed by Richardean Canal [phone]) on 05/23/2022 10:28:42 PM  Radiology DG Chest 2 View  Result Date: 05/23/2022 CLINICAL DATA:  Palpitations EXAM: CHEST - 2  VIEW COMPARISON:  Radiographs 08/04/2020 FINDINGS: The heart size and mediastinal contours are within normal limits. Both lungs are clear. The visualized skeletal structures are unremarkable. IMPRESSION: No active cardiopulmonary disease. Electronically Signed   By: Minerva Fester M.D.   On: 05/23/2022 21:09    Procedures Procedures    Medications Ordered in ED Medications  sodium chloride 0.9 % bolus 1,000 mL (has no administration in time range)  LORazepam (ATIVAN) injection 1 mg (has no administration in time range)    ED Course/ Medical Decision Making/ A&P                             Medical Decision Making Linda Small is a 42 y.o. female here with palpitations.  Patient is not having shortness of breath or chest pain.  Patient states that she feels anxious.  I think she likely has panic attack versus symptomatic hyperthyroidism.  Low suspicion for ACS or dissection or PE.  Plan to get CBC and BMP and TSH.  Will hydrate and give Ativan and reassess  11:35 PM I reviewed patient's labs and patient's bicarb is 15 and patient does have an anion gap at 19.  Patient appears dehydrated and her TSH is normal.  Chest x-ray is clear.  Patient adamant that she did not overdose.  In particular she states that she does not drink alcohol or do any drugs except marijuana.  She is feeling more calm after Ativan.  I do not know why she is tachycardic and she has no PE risk factors right now.  I will start her on propranolol and have her follow-up with cardiology for sinus tachycardia.  Problems Addressed: Dehydration: acute illness or injury Sinus tachycardia: acute illness or injury  Amount and/or Complexity of Data Reviewed Labs: ordered. Decision-making details documented in ED Course. Radiology: ordered and independent interpretation performed. Decision-making details documented in ED Course. ECG/medicine tests: ordered and independent interpretation performed. Decision-making details  documented in ED Course.  Risk Prescription drug management.    Final Clinical Impression(s) / ED Diagnoses Final diagnoses:  None    Rx / DC Orders ED Discharge Orders     None         Charlynne Pander, MD 05/23/22 2336

## 2022-05-23 NOTE — Discharge Instructions (Signed)
You have tachycardia with no clear etiology.  I have started you on propranolol and you can take it if your heart rate is greater than 100  I have referred you to cardiology for follow-up  You also need to kidney function rechecked in a week  Please stay hydrated  Return to ER if you have palpitation or chest pain

## 2022-05-24 NOTE — ED Notes (Signed)
1mg  Ativan wasted with Katheran James RN

## 2022-05-24 NOTE — ED Notes (Signed)
Pt removed IV, refused further care, declined medication for tachycardia. Per MD Silverio Lay, continue with DC

## 2022-06-01 ENCOUNTER — Encounter: Payer: Self-pay | Admitting: *Deleted

## 2022-06-22 ENCOUNTER — Emergency Department (HOSPITAL_COMMUNITY): Payer: No Typology Code available for payment source

## 2022-06-22 ENCOUNTER — Emergency Department (HOSPITAL_COMMUNITY)
Admission: EM | Admit: 2022-06-22 | Discharge: 2022-06-23 | Disposition: A | Discharge status: Other Acute Inpatient Hospital | Payer: No Typology Code available for payment source | Attending: Emergency Medicine | Admitting: Emergency Medicine

## 2022-06-22 ENCOUNTER — Other Ambulatory Visit: Payer: Self-pay

## 2022-06-22 ENCOUNTER — Encounter (HOSPITAL_COMMUNITY): Payer: Self-pay

## 2022-06-22 DIAGNOSIS — F332 Major depressive disorder, recurrent severe without psychotic features: Secondary | ICD-10-CM | POA: Insufficient documentation

## 2022-06-22 DIAGNOSIS — R079 Chest pain, unspecified: Secondary | ICD-10-CM | POA: Insufficient documentation

## 2022-06-22 DIAGNOSIS — Z79899 Other long term (current) drug therapy: Secondary | ICD-10-CM | POA: Insufficient documentation

## 2022-06-22 DIAGNOSIS — R479 Unspecified speech disturbances: Secondary | ICD-10-CM | POA: Diagnosis present

## 2022-06-22 DIAGNOSIS — R000 Tachycardia, unspecified: Secondary | ICD-10-CM | POA: Diagnosis not present

## 2022-06-22 DIAGNOSIS — F29 Unspecified psychosis not due to a substance or known physiological condition: Secondary | ICD-10-CM

## 2022-06-22 DIAGNOSIS — R002 Palpitations: Secondary | ICD-10-CM | POA: Diagnosis not present

## 2022-06-22 DIAGNOSIS — Z20822 Contact with and (suspected) exposure to covid-19: Secondary | ICD-10-CM | POA: Insufficient documentation

## 2022-06-22 LAB — SALICYLATE LEVEL: Salicylate Lvl: <7 mg/dL — ABNORMAL LOW (ref 7.0–30.0)

## 2022-06-22 LAB — CBC WITH DIFFERENTIAL/PLATELET
Abs Immature Granulocytes: 0.03 10*3/uL (ref 0.00–0.07)
Basophils Absolute: 0 10*3/uL (ref 0.0–0.1)
Basophils Relative: 0 %
Eosinophils Absolute: 0 10*3/uL (ref 0.0–0.5)
Eosinophils Relative: 0 %
HCT: 32.1 % — ABNORMAL LOW (ref 36.0–46.0)
Hemoglobin: 10.7 g/dL — ABNORMAL LOW (ref 12.0–15.0)
Immature Granulocytes: 0 %
Lymphocytes Relative: 30 %
Lymphs Abs: 3 10*3/uL (ref 0.7–4.0)
MCH: 31.1 pg (ref 26.0–34.0)
MCHC: 33.3 g/dL (ref 30.0–36.0)
MCV: 93.3 fL (ref 80.0–100.0)
Monocytes Absolute: 0.8 10*3/uL (ref 0.1–1.0)
Monocytes Relative: 8 %
Neutro Abs: 6.2 10*3/uL (ref 1.7–7.7)
Neutrophils Relative %: 62 %
Platelets: 203 10*3/uL (ref 150–400)
RBC: 3.44 MIL/uL — ABNORMAL LOW (ref 3.87–5.11)
RDW: 13.6 % (ref 11.5–15.5)
WBC: 10.1 10*3/uL (ref 4.0–10.5)
nRBC: 0 % (ref 0.0–0.2)

## 2022-06-22 LAB — COMPREHENSIVE METABOLIC PANEL
ALT: 23 U/L (ref 0–44)
AST: 43 U/L — ABNORMAL HIGH (ref 15–41)
Albumin: 3.5 g/dL (ref 3.5–5.0)
Alkaline Phosphatase: 46 U/L (ref 38–126)
Anion gap: 11 (ref 5–15)
BUN: 5 mg/dL — ABNORMAL LOW (ref 6–20)
CO2: 20 mmol/L — ABNORMAL LOW (ref 22–32)
Calcium: 8.7 mg/dL — ABNORMAL LOW (ref 8.9–10.3)
Chloride: 109 mmol/L (ref 98–111)
Creatinine, Ser: 0.81 mg/dL (ref 0.44–1.00)
GFR, Estimated: >60 mL/min (ref >60)
Glucose, Bld: 89 mg/dL (ref 70–99)
Potassium: 4.1 mmol/L (ref 3.5–5.1)
Sodium: 140 mmol/L (ref 135–145)
Total Bilirubin: 1.2 mg/dL (ref 0.3–1.2)
Total Protein: 5.8 g/dL — ABNORMAL LOW (ref 6.5–8.1)

## 2022-06-22 LAB — RAPID URINE DRUG SCREEN, HOSP PERFORMED
Amphetamines: NOT DETECTED
Barbiturates: NOT DETECTED
Benzodiazepines: NOT DETECTED
Cocaine: NOT DETECTED
Opiates: NOT DETECTED
Tetrahydrocannabinol: NOT DETECTED

## 2022-06-22 LAB — TROPONIN I (HIGH SENSITIVITY): Troponin I (High Sensitivity): 5 ng/L (ref <18)

## 2022-06-22 LAB — ACETAMINOPHEN LEVEL: Acetaminophen (Tylenol), Serum: <10 ug/mL — ABNORMAL LOW (ref 10–30)

## 2022-06-22 LAB — MAGNESIUM: Magnesium: 2.1 mg/dL (ref 1.7–2.4)

## 2022-06-22 MED ORDER — SODIUM CHLORIDE 0.9 % IV BOLUS
1000.0000 mL | Freq: Once | INTRAVENOUS | Status: AC
  Administered 2022-06-22: 1000 mL via INTRAVENOUS

## 2022-06-22 NOTE — ED Provider Notes (Signed)
[place] EMERGENCY DEPARTMENT AT [facility] Provider Note   CSN: [id number] Arrival date & time: 06/22/22  2116     History  Chief Complaint  Patient presents with   Drug Overdose    Linda Small is a 42 y.o. female.  The history is provided by the patient, a friend and medical records. No language interpreter was used.  Mental Health Problem Presenting symptoms: no agitation, no homicidal ideas, no suicidal thoughts, no suicidal threats and no suicide attempt   Presenting symptoms comment:  Sleep disturbance  Degree of incapacity (severity):  Moderate Onset quality:  Gradual Progression:  Worsening Chronicity:  Recurrent Associated symptoms: chest pain (palpitations)   Associated symptoms: no abdominal pain, no anxiety, no fatigue and no headaches        Home Medications Prior to Admission medications   Medication Sig Start Date End Date Taking? Authorizing Provider  cyclobenzaprine (FLEXERIL) 5 MG tablet Take 1 tablet (5 mg total) by mouth 3 (three) times daily as needed for muscle spasms. 09/28/19   Linus Mako B, NP  Divalproex Sodium (DEPAKOTE PO) Take 1 tablet by mouth daily.    [provider]  prazosin (MINIPRESS) 1 MG capsule  09/09/18   [provider]  propranolol (INDERAL) 10 MG tablet Take 1 tablet (10 mg total) by mouth 3 (three) times daily as needed (if heart rate > 100). 05/23/22   Charlynne Pander, MD  QUEtiapine (SEROQUEL) 200 MG tablet  09/09/18   [provider]      Allergies    Patient has no known allergies.    Review of Systems   Review of Systems  Constitutional:  Negative for chills, fatigue and fever.  HENT:  Negative for congestion.   Respiratory:  Negative for cough, chest tightness, shortness of breath and wheezing.   Cardiovascular:  Positive for chest pain (palpitations).  Gastrointestinal:  Negative for abdominal pain, constipation, diarrhea, nausea and vomiting.  Genitourinary:  Negative for  dysuria and flank pain.  Musculoskeletal:  Negative for back pain, neck pain and neck stiffness.  Skin:  Negative for rash and wound.  Neurological:  Negative for weakness, light-headedness, numbness and headaches.  Psychiatric/Behavioral:  Positive for sleep disturbance. Negative for agitation, confusion, homicidal ideas and suicidal ideas. The patient is not nervous/anxious.   All other systems reviewed and are negative.   Physical Exam Updated Vital Signs BP 120/87 (BP Location: Right Arm)   Pulse (!) 120   Temp 98.9 F (37.2 C) (Oral)   Resp 16   Ht 5\' 1"  (1.549 m)   Wt 54.4 kg   SpO2 97%   BMI 22.67 kg/m  Physical Exam Vitals and nursing note reviewed.  Constitutional:      General: She is not in acute distress.    Appearance: She is well-developed.  HENT:     Head: Normocephalic and atraumatic.     Nose: No congestion or rhinorrhea.     Mouth/Throat:     Mouth: Mucous membranes are moist.  Eyes:     Extraocular Movements: Extraocular movements intact.     Conjunctiva/sclera: Conjunctivae normal.     Pupils: Pupils are equal, round, and reactive to light.  Cardiovascular:     Rate and Rhythm: Regular rhythm. Tachycardia present.     Heart sounds: No murmur heard. Pulmonary:     Effort: Pulmonary effort is normal. No respiratory distress.     Breath sounds: Normal breath sounds. No wheezing, rhonchi or rales.  Chest:  Chest wall: No tenderness.  Abdominal:     Palpations: Abdomen is soft.     Tenderness: There is no abdominal tenderness. There is no right CVA tenderness, left CVA tenderness, guarding or rebound.  Musculoskeletal:        General: No swelling.     Cervical back: Neck supple. No tenderness.  Skin:    General: Skin is warm and dry.     Capillary Refill: Capillary refill takes less than 2 seconds.     Findings: No erythema.  Neurological:     General: No focal deficit present.     Mental Status: She is alert.     Sensory: No sensory deficit.      Motor: No weakness.  Psychiatric:        Behavior: Behavior is slowed.        Thought Content: Thought content is not paranoid. Thought content does not include homicidal or suicidal ideation.     ED Results / Procedures / Treatments   Labs (all labs ordered are listed, but only abnormal results are displayed) Labs Reviewed  CBC WITH DIFFERENTIAL/PLATELET - Abnormal; Notable for the following components:      Result Value   RBC 3.44 (*)    Hemoglobin 10.7 (*)    HCT 32.1 (*)    All other components within normal limits  ACETAMINOPHEN LEVEL - Abnormal; Notable for the following components:   Acetaminophen (Tylenol), Serum <10 (*)    All other components within normal limits  SALICYLATE LEVEL - Abnormal; Notable for the following components:   Salicylate Lvl <7.0 (*)    All other components within normal limits  RAPID URINE DRUG SCREEN, HOSP PERFORMED  COMPREHENSIVE METABOLIC PANEL  TSH  MAGNESIUM  TROPONIN I (HIGH SENSITIVITY)    EKG None  Radiology DG Chest Portable 1 View  Result Date: 06/22/2022 CLINICAL DATA:  Palpitation EXAM: PORTABLE CHEST 1 VIEW COMPARISON:  05/23/2022 FINDINGS: The heart size and mediastinal contours are within normal limits. Both lungs are clear. The visualized skeletal structures are unremarkable. IMPRESSION: No active disease. Electronically Signed   By: Jasmine Pang M.D.   On: 06/22/2022 23:05    Procedures Procedures    Medications Ordered in ED Medications  sodium chloride 0.9 % bolus 1,000 mL (1,000 mLs Intravenous Bolus 06/22/22 2316)    ED Course/ Medical Decision Making/ A&P                             Medical Decision Making Amount and/or Complexity of Data Reviewed Labs: ordered. Radiology: ordered.    Linda Small is a 42 y.o. 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  According to the friend accompany the patient, the patient has not been sleeping for several days and tonight was [place] and was told by law enforcement she needed to get checked out for psychiatric illness.  They report that patient has been seen and discharged from various facilities over the last week after she was found to be psychiatric and medically cleared.  Patient says she is still not sleeping and took an extra sleeping pill this evening although she does not know what it was.  She denies any alcohol or drug use otherwise.  She reports that over the years she has had palpitations when  she gets stressed and anxious exacerbating it.  She has had workup for palpitations in the chart and she reports this feels similar.  She otherwise is denying shortness of breath and denies history of blood clots.  She denies any fevers, chills, congestion, cough, nausea, vomiting, constipation, diarrhea, or urinary changes.  Denying any other pain at this time.  Patient is drowsy and says she was to try to get some sleep.  She denies suicidal ideation or homicidal ideation at this time.  Patient says she took an extra sleeping pill but does not know what it was.  Chart appears to show she has trazodone, Seroquel, and quetiapine as psychiatric meds.  On exam, lungs clear.  Chest nontender.  No murmur.  Patient is tachycardic with rates between 1 10-1 30 during my initial evaluation.  Abdomen nontender.  Good pulses in extremities.  No focal neurologic deficits however patient is somewhat somnolent and drowsy.  Had a shared decision-making conversation with patient and her friend.  We agreed to get a workup for her palpitations to rule out electrolyte abnormality and give her some fluids for what I suspect is some degree of dehydration with the not sleeping and dry mouth on exam.  We will get TSH and other labs.  However, given her lack of sleep, history of bipolar, and  the friend reporting that the patient has appeared to be not acting like herself with some psychosis, I do feel we will likely need to consult TTS as well.  Will place screening labs and metabolic workup however I suspect she is simply having her chronic palpitations in the setting of no sleep, anxiety, and this psychiatric problem.  Care will be transferred to oncoming team to wait for completion of medical workup and psychiatric evaluation.         Final Clinical Impression(s) / ED Diagnoses Final diagnoses:  Palpitations    Clinical Impression: 1. Palpitations     Disposition: Care will be transferred to oncoming team to wait for completion of medical workup and psychiatric evaluation.  This note was prepared with assistance of Conservation officer, historic buildings. Occasional wrong-word or sound-a-like substitutions may have occurred due to the inherent limitations of voice recognition software.      Dontravious Camille, Canary Brim, MD 06/22/22 [phone]
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
## 2022-06-22 NOTE — ED Triage Notes (Signed)
Pt from home, pt took twice the dose of all her psych meds Pt unable to tell me how many meds she took Just released from [facility] yesterday for SI Pt denies SI, states she was trying to get some sleep Pt drowsy

## 2022-06-22 NOTE — ED Notes (Signed)
TTS in room.  

## 2022-06-23 LAB — SARS CORONAVIRUS 2 BY RT PCR: SARS Coronavirus 2 by RT PCR: NEGATIVE

## 2022-06-23 LAB — TSH: TSH: 2.957 u[IU]/mL (ref 0.350–4.500)

## 2022-06-23 MED ORDER — UREA 10 % EX LOTN: 1.0000 | TOPICAL_LOTION | Freq: Every day | CUTANEOUS | Status: DC

## 2022-06-23 MED ORDER — TRAZODONE HCL 100 MG PO TABS: 100.0000 mg | ORAL_TABLET | Freq: Every evening | ORAL | Status: DC | PRN

## 2022-06-23 MED ORDER — QUETIAPINE FUMARATE 200 MG PO TABS: 300.0000 mg | ORAL_TABLET | Freq: Two times a day (BID) | ORAL | Status: DC

## 2022-06-23 MED ORDER — LORAZEPAM 1 MG PO TABS
1.0000 mg | ORAL_TABLET | Freq: Three times a day (TID) | ORAL | Status: DC | PRN
  Administered 2022-06-23: 1 mg via ORAL
  Filled 2022-06-23: qty 1

## 2022-06-23 MED ORDER — VITAMIN D (ERGOCALCIFEROL) 1.25 MG (50000 UNIT) PO CAPS: 50000.0000 [IU] | ORAL_CAPSULE | ORAL | Status: DC

## 2022-06-23 MED ORDER — DIVALPROEX SODIUM ER 250 MG PO TB24: 250.0000 mg | ORAL_TABLET | Freq: Every day | ORAL | Status: DC

## 2022-06-23 MED ORDER — METFORMIN HCL 500 MG PO TABS: 500.0000 mg | ORAL_TABLET | Freq: Two times a day (BID) | ORAL | Status: DC

## 2022-06-23 MED ORDER — SERTRALINE HCL 50 MG PO TABS: 50.0000 mg | ORAL_TABLET | Freq: Every day | ORAL | Status: DC

## 2022-06-23 MED ORDER — PROPRANOLOL HCL 10 MG PO TABS: 10.0000 mg | ORAL_TABLET | Freq: Three times a day (TID) | ORAL | Status: DC | PRN

## 2022-06-23 NOTE — ED Notes (Signed)
Officer Turner to pick up by 430

## 2022-06-23 NOTE — ED Notes (Signed)
IVC Completed. EXP:06/30/22 Original in [employer]. 3 copies on purple clipboard in purple with patient. Faxed to [facility].

## 2022-06-23 NOTE — ED Notes (Signed)
Pt medically cleared by Dr Pilar Plate

## 2022-06-23 NOTE — ED Notes (Signed)
Notified Arvilla Market NP that pt requesting meds for anxiety

## 2022-06-23 NOTE — Progress Notes (Signed)
1:19 PM - CSW received return phone call from April, Clinical cytogeneticist, at [facility]. Pt has been accepted to [facility] for today 5/31, however April is not able to give CSW full accepting information without the findings and custody order portion of IVC paperwork. CSW will fax full IVC paperwork over to [facility] once received.  Cathie Beams, LCSW  06/23/2022 1:22 PM

## 2022-06-23 NOTE — Progress Notes (Addendum)
ADDENDUM  10:26 AM - CSW received return phone call from April, Clinical cytogeneticist, at [facility]. April reports [place] does have bed availability for pt. CSW will complete mental health transfer sheet and [id number] form, along with referral packet to be faxed to [phone].  10:04 AM - CSW attempted to speak with Vernona Rieger [phone], transfer coordinator, at [facility] via phone call. CSW was unable to speak with Vernona Rieger, but left a voicemail inquiring about bed availability and requested a return phone call. CSW will await return phone call from Vernona Rieger.  Cathie Beams, LCSW  06/23/2022 10:07 AM

## 2022-06-23 NOTE — ED Notes (Signed)
Went to check on pt to see what she needed d/t she came to the desk during report regarding her phone. Pt would not answer this RN and just sat in bed and stared at me. Explained to pt that I can't help her if she doesn't talk to me. Pt continued to just stare at this nurse and not speak. Let pt know that I'm here whenever she wants to talk.
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
## 2022-06-23 NOTE — Progress Notes (Addendum)
ADDENDUM  12:27 PM - CSW sent complete referral packet to [facility] for review. CSW will await follow up from [place] for admission decision.  12:09 PM - CSW sent referral packet, including mental health transfer sheet and [id number] form to [facility] [phone] for review. CSW will await covid results and fax over once collected and results become available.  Cathie Beams, [place]  06/23/2022 12:10 PM

## 2022-06-23 NOTE — ED Notes (Signed)
Attempted to call report to RN at [facility]. Requested that we call back when transport has arrived.

## 2022-06-23 NOTE — ED Provider Notes (Signed)
  Provider Note MRN:  [id number]  Arrival date & time: 06/23/22    ED Course and Medical Decision Making  Assumed care from Dr. Julieanne Manson at shift change.  Concern for psychosis, no SI, took an extra sleeping pill because of having trouble sleeping, was found wandering around, mild tachycardia awaiting medical clearance prior to TTS evaluation.  12:45 AM update: Patient is now medically cleared.  Signed out to default provider.  Procedures  Final Clinical Impressions(s) / ED Diagnoses     ICD-10-CM   1. Palpitations  R00.2     2. Psychosis, unspecified psychosis type [facility])  F29       ED Discharge Orders     None       Discharge Instructions   None     Elmer Sow. Pilar Plate, MD [facility] Emergency Medicine [facility] [email or web address]    Sabas Sous, MD 06/23/22 [phone]

## 2022-06-23 NOTE — ED Notes (Signed)
Pt up to nurses station to use phone.

## 2022-06-23 NOTE — Progress Notes (Signed)
Pt was accepted to [facility] TODAY 06/23/2022. Bed assignment: Mental Health Unit, Bed: 122  Pt meets inpatient criteria per Ophelia Shoulder, NP  Attending Physician will be Donzetta Matters, MD  Report can be called to: [phone] ext. [phone] or [phone]  Bed is ready now  Care Team Notified: Ophelia Shoulder, NP, Juanetta Beets, NT, [place] Nickerson, RN, Fenton Malling, RN, and Earnestine Leys, NT  [place], [place]  06/23/2022 3:41 PM

## 2022-06-23 NOTE — ED Provider Notes (Addendum)
Emergency Medicine Observation Re-evaluation Note  Linda MCDUFFEY is a 42 y.o. female, seen on rounds today.  Pt initially presented to the ED for complaints of Drug Overdose Currently, the patient is sitting up but not speaking.  Physical Exam  BP (!) 117/92 Comment: nurse was notified  Pulse 98   Temp 98.4 F (36.9 C) (Oral)   Resp 18   Ht 5\' 1"  (1.549 m)   Wt 54.4 kg   SpO2 100%   BMI 22.67 kg/m  Physical Exam General: sitting up, doesn't speak Lungs: normal effort Psych: not speaking  ED Course / MDM  EKG:EKG Interpretation  Date/Time:  Friday Jun 23 2022 00:25:55 EDT Ventricular Rate:  111 PR Interval:  137 QRS Duration: 70 QT Interval:  333 QTC Calculation: 453 R Axis:   86 Text Interpretation: Sinus tachycardia Nonspecific T abnrm, anterolateral leads Confirmed by Kennis Carina [phone]) on 06/23/2022 12:41:30 AM  I have reviewed the labs performed to date as well as medications administered while in observation.  No recent changes in the last 24 hours . Plan  Current plan is for inpatient psychiatric admission for psychosis.    Pricilla Loveless, MD 06/23/22 0818  4:10 PM Accepted to [facility]. Accepting is Donzetta Matters MD. Stable for transfer.   Pricilla Loveless, MD 06/23/22 [phone]

## 2022-07-24 ENCOUNTER — Emergency Department (HOSPITAL_COMMUNITY)
Admission: EM | Admit: 2022-07-24 | Discharge: 2022-07-25 | Disposition: A | Discharge status: Other Acute Inpatient Hospital | Payer: No Typology Code available for payment source | Attending: Emergency Medicine | Admitting: Emergency Medicine

## 2022-07-24 ENCOUNTER — Other Ambulatory Visit: Payer: Self-pay

## 2022-07-24 DIAGNOSIS — F3164 Bipolar disorder, current episode mixed, severe, with psychotic features: Secondary | ICD-10-CM | POA: Insufficient documentation

## 2022-07-24 DIAGNOSIS — H5713 Ocular pain, bilateral: Secondary | ICD-10-CM | POA: Diagnosis present

## 2022-07-24 DIAGNOSIS — Z1152 Encounter for screening for COVID-19: Secondary | ICD-10-CM | POA: Insufficient documentation

## 2022-07-24 DIAGNOSIS — F1290 Cannabis use, unspecified, uncomplicated: Secondary | ICD-10-CM | POA: Insufficient documentation

## 2022-07-24 DIAGNOSIS — T65894A Toxic effect of other specified substances, undetermined, initial encounter: Secondary | ICD-10-CM | POA: Insufficient documentation

## 2022-07-24 DIAGNOSIS — F29 Unspecified psychosis not due to a substance or known physiological condition: Secondary | ICD-10-CM

## 2022-07-24 LAB — CBC
Hemoglobin: 13.7 g/dL (ref 12.0–15.0)
MCH: 31.1 pg (ref 26.0–34.0)
MCHC: 33.3 g/dL (ref 30.0–36.0)
Platelets: 436 10*3/uL — ABNORMAL HIGH (ref 150–400)
RBC: 4.41 MIL/uL (ref 3.87–5.11)
RDW: 14.1 % (ref 11.5–15.5)
WBC: 22.4 10*3/uL — ABNORMAL HIGH (ref 4.0–10.5)
nRBC: 0 % (ref 0.0–0.2)

## 2022-07-24 LAB — RAPID URINE DRUG SCREEN, HOSP PERFORMED
Amphetamines: NOT DETECTED
Barbiturates: NOT DETECTED
Benzodiazepines: NOT DETECTED
Opiates: NOT DETECTED
Tetrahydrocannabinol: POSITIVE — AB

## 2022-07-24 LAB — ETHANOL: Alcohol, Ethyl (B): <10 mg/dL (ref <10)

## 2022-07-24 LAB — HCG, QUANTITATIVE, PREGNANCY: hCG, Beta Chain, Quant, S: 7 m[IU]/mL — ABNORMAL HIGH (ref <5)

## 2022-07-24 MED ORDER — SODIUM CHLORIDE 0.9 % IV BOLUS
1000.0000 mL | Freq: Once | INTRAVENOUS | Status: AC
  Administered 2022-07-24: 1000 mL via INTRAVENOUS

## 2022-07-24 MED ORDER — TETRACAINE HCL 0.5 % OP SOLN
2.0000 [drp] | Freq: Once | OPHTHALMIC | Status: DC
  Filled 2022-07-24: qty 4

## 2022-07-24 MED ORDER — HALOPERIDOL LACTATE 5 MG/ML IJ SOLN
5.0000 mg | Freq: Once | INTRAMUSCULAR | Status: AC
  Administered 2022-07-24: 5 mg via INTRAMUSCULAR
  Filled 2022-07-24: qty 1

## 2022-07-24 MED ORDER — LORAZEPAM 2 MG/ML IJ SOLN
2.0000 mg | Freq: Once | INTRAMUSCULAR | Status: AC
  Administered 2022-07-24: 2 mg via INTRAVENOUS
  Filled 2022-07-24: qty 1

## 2022-07-25 DIAGNOSIS — F29 Unspecified psychosis not due to a substance or known physiological condition: Secondary | ICD-10-CM

## 2022-07-25 DIAGNOSIS — F3164 Bipolar disorder, current episode mixed, severe, with psychotic features: Secondary | ICD-10-CM | POA: Insufficient documentation

## 2022-07-25 LAB — COMPREHENSIVE METABOLIC PANEL
ALT: 16 U/L (ref 0–44)
Anion gap: 20 — ABNORMAL HIGH (ref 5–15)
Chloride: 101 mmol/L (ref 98–111)
Creatinine, Ser: 1.17 mg/dL — ABNORMAL HIGH (ref 0.44–1.00)
GFR, Estimated: 31 mL/min — ABNORMAL LOW (ref >60)
Glucose, Bld: 140 mg/dL — ABNORMAL HIGH (ref 70–99)
Total Bilirubin: 0.9 mg/dL (ref 0.3–1.2)

## 2022-07-25 LAB — COMPREHENSIVE METABOLIC PANEL WITH GFR
AST: 34 U/L (ref 15–41)
Alkaline Phosphatase: 59 U/L (ref 38–126)
BUN: 19 mg/dL (ref 6–20)
CO2: 20 mmol/L — ABNORMAL LOW (ref 22–32)
Calcium: 10.2 mg/dL (ref 8.9–10.3)
Sodium: 141 mmol/L (ref 135–145)

## 2022-07-25 LAB — SARS CORONAVIRUS 2 BY RT PCR: SARS Coronavirus 2 by RT PCR: NEGATIVE

## 2022-07-25 MED ORDER — OLANZAPINE 10 MG IM SOLR: 10.0000 mg | Freq: Three times a day (TID) | INTRAMUSCULAR | Status: DC | PRN

## 2022-07-25 MED ORDER — OLANZAPINE 5 MG PO TBDP
5.0000 mg | ORAL_TABLET | Freq: Two times a day (BID) | ORAL | Status: DC
  Filled 2022-07-25: qty 1

## 2022-07-25 MED ORDER — DIVALPROEX SODIUM 250 MG PO DR TAB
250.0000 mg | DELAYED_RELEASE_TABLET | Freq: Two times a day (BID) | ORAL | Status: DC
  Administered 2022-07-25: 250 mg via ORAL
  Filled 2022-07-25: qty 1

## 2022-07-25 MED ORDER — LORAZEPAM 1 MG PO TABS
2.0000 mg | ORAL_TABLET | Freq: Four times a day (QID) | ORAL | Status: DC | PRN
  Administered 2022-07-25: 2 mg via ORAL
  Filled 2022-07-25: qty 2

## 2022-07-25 NOTE — ED Notes (Signed)
IVC'd 07/24/22, exp 07/30/22

## 2022-07-25 NOTE — ED Notes (Signed)
Patient verified that her first name is Linda Small. Uncooperative with additional questions. See NP note.

## 2022-07-25 NOTE — ED Notes (Addendum)
Patient's true Identity has been retrieved; RN informed Consulting civil engineer, Registration, and new IVC papers will be done with correct info or whatever Magistrate relays to Diplomatic Services operational officer; Patient's real name is Linda L. Schone-Monique,RN

## 2022-07-25 NOTE — ED Notes (Signed)
Aunt, Bill Salinas, notified that the patient is transferring to [facility] at this time.

## 2022-07-25 NOTE — BH Assessment (Signed)
Clinician reached out to RN to inquire on completing TTS assessment. RN reports patient is not verbally engaging with staff at this time.   Manfred Arch, MSW, LCSW Triage Specialist [phone]

## 2022-07-25 NOTE — Progress Notes (Signed)
Pt was accepted to [facility] TODAY 07/25/2022. Assignment: Building 8, Room 111, Bed 1  Pt meets inpatient criteria per Eligha Bridegroom, NP  Attending Physician will be Lilli Few, PA  Report can be called to: [phone] (ext: [phone])  Pt can arrive anytime; bed is ready now  Care Team Notified: Eligha Bridegroom, NP, Maren Reamer, RN, and [address] LCSWA  [place], [place]  07/25/2022 4:12 PM
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
## 2022-07-25 NOTE — ED Notes (Signed)
Pt IVC paperwork up to date with correct name by this NS.

## 2022-07-25 NOTE — Progress Notes (Addendum)
11:04 AM - CSW attempted to speak with April, transfer coordinator, at [facility] regarding bed availability for inpatient treatment. CSW left voicemail requesting call back to discuss pt. CSW will await return phone call.  10:41 AM - CSW attempted to speak with Vernona Rieger, Clinical cytogeneticist, at [facility] regarding bed availability for inpatient treatment.  Cathie Beams, LCSW  07/25/2022 10:44 AM

## 2022-07-25 NOTE — ED Notes (Signed)
Patient woke up and tried to walk out the unit; pt redirected to the bathroom or back to her room; patient went to the bathroom and removed all clothing and sat on the floor; RN and sitter had patient to redress and walked her back to her room; pt in bed calm at this time-Monique,RN

## 2022-07-25 NOTE — Progress Notes (Addendum)
ADDENDUM  3:37 PM - CSW spoke with Vernona Rieger, Clinical cytogeneticist, at [facility] via phone call. Vernona Rieger confirmed they have received referral. Vernona Rieger reports pt will be reviewed by either daytime provider by 4 PM or AOD nightshift provider after 4 PM today.  2:42 PM - CSW sent completed referral packet to [facility] [phone], via fax for review. CSW will await follow up.  1:51 PM - Per April, transfer coordinator, [facility] does have available beds. CSW completed mental health transfer sheet and faxed over referral packet to [facility] for review. CSW is awaiting physician signature on [id number] consent to transfer form, before faxing over.  Cathie Beams, [place]  07/25/2022 1:53 PM

## 2023-10-22 ENCOUNTER — Other Ambulatory Visit (HOSPITAL_COMMUNITY): Payer: Self-pay

## 2023-10-22 ENCOUNTER — Ambulatory Visit: Admitting: Radiology

## 2023-10-22 ENCOUNTER — Ambulatory Visit (INDEPENDENT_AMBULATORY_CARE_PROVIDER_SITE_OTHER): Payer: Self-pay | Admitting: Radiology

## 2023-10-22 ENCOUNTER — Ambulatory Visit
Admission: EM | Admit: 2023-10-22 | Discharge: 2023-10-22 | Disposition: A | Discharge status: Home / Self Care | Attending: Physician Assistant | Admitting: Physician Assistant

## 2023-10-22 ENCOUNTER — Other Ambulatory Visit: Payer: Self-pay

## 2023-10-22 DIAGNOSIS — M795 Residual foreign body in soft tissue: Secondary | ICD-10-CM

## 2023-10-22 DIAGNOSIS — S60459A Superficial foreign body of unspecified finger, initial encounter: Secondary | ICD-10-CM

## 2023-10-22 MED ORDER — CEPHALEXIN 500 MG PO CAPS
500.0000 mg | ORAL_CAPSULE | Freq: Two times a day (BID) | ORAL | 0 refills | Status: AC
  Filled 2023-10-22: qty 10, 5d supply, fill #0

## 2023-10-22 NOTE — ED Triage Notes (Signed)
 Pt presents with concerns of metal in right thumb. States on Friday 9/26, she was cutting sheet metal. Felt a sharp sensation in finger at the time. Has been causing her pain since. Currently rates overall pain a 5/10. Has attempted to remove the metal with tweezers but has been unsuccessful. Pt reports her TDAP vaccine is up-to-date.

## 2023-10-22 NOTE — ED Provider Notes (Signed)
 [facility]    CSN: [id number] Arrival date & time: 10/22/23  0816      History   Chief Complaint Chief Complaint  Patient presents with   Foreign Body in Finger    HPI Linda Small is a 43 y.o. female.  has a past medical history of Bipolar 1 disorder (HCC) and PTSD (post-traumatic stress disorder).   HPI Pt reports concerns for splinter in the right thumb.She states she has tried multiple attempts to get the splinter out. She states it used to only hurt when pressure was applied but now it is painful all the time. She reports it has been present since Friday AM  Pt states that her last tetanus booster was administered last year during her physical. 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      Home Medications    Prior to Admission medications   Medication Sig Start Date End Date Taking? Authorizing Provider  cephALEXin (KEFLEX) 500 MG capsule Take 1 capsule (500 mg total) by mouth 2 (two) times daily for 5 days. 10/22/23 10/27/23 Yes Ravinder Lukehart E, PA-C  Alogliptin Benzoate 12.5 MG TABS Take 0.5 tablets by mouth daily. 12/26/21 06/07/23  [provider]  cyclobenzaprine  (FLEXERIL ) 10 MG tablet Take 10 mg by mouth 3 (three) times daily as needed for muscle spasms.  02/28/22   [provider]  cyclobenzaprine  (FLEXERIL ) 5 MG tablet Take 1 tablet (5 mg total) by mouth 3 (three) times daily as needed for muscle spasms. 09/28/19   Burky, Natalie B, NP  diclofenac Sodium (VOLTAREN) 1 % GEL Apply 1 Application topically 4 (four) times daily. 02/28/22   [provider]  divalproex  (DEPAKOTE  ER) 250 MG 24 hr tablet Take 250 mg by mouth daily. 06/16/22 07/16/22  [provider]  metFORMIN  (GLUCOPHAGE -XR) 500 MG 24 hr tablet Take 500 mg by mouth in the morning and at bedtime. 06/29/21 06/07/23  [provider]  naloxone [facility]) nasal spray 4 mg/0.1 mL Place 1 spray into the nose See admin instructions. SPRAY 1 SPRAY INTO ONE NOSTRIL AS DIRECTED FOR OPIOID OVERDOSE - CALL 911 IMMEDIATELY, ADMINISTER DOSE, THEN TURN PERSON ON SIDE - IF NO RESPONSE IN 2-3 MINUTES OR PERSON RESPONDS BUT RELAPSES, REPEAT USING A NEW SPRAY DEVICE AND SPRAY INTO THE OTHER NOSTRIL 06/06/22   [provider]  prazosin (MINIPRESS) 1 MG capsule  09/09/18   [provider]  propranolol  (INDERAL ) 10 MG tablet Take 1 tablet (10 mg total) by mouth 3 (three) times daily as needed (if heart rate > 100). 05/23/22   Patt Alm Macho, MD  QUEtiapine  (SEROQUEL ) 200 MG tablet  09/09/18   [provider]  sertraline  (ZOLOFT ) 50 MG tablet  Take 50 mg by mouth daily. 06/06/22   [provider]  Vitamin D , Ergocalciferol , (DRISDOL ) 1.25 MG (50000 UNIT) CAPS capsule Take 50,000 Units by mouth once a week. 02/10/22   [provider]    Family History Family History  Problem Relation Age of Onset   Healthy Mother    Healthy Father     Social History Social History   Tobacco Use   Smoking status: Every Day    Types: Cigars   Smokeless tobacco: Never   Tobacco comments:    one black and mild a day  Vaping Use   Vaping status: Never Used  Substance Use Topics   Alcohol use: Yes    Comment: occasionally   Drug use: Yes    Types:  Marijuana     Allergies   Medical adhesive remover and Metformin    Review of Systems Review of Systems  Skin:        Splinter in right thumb      Physical Exam Triage Vital Signs ED Triage Vitals  Encounter Vitals Group     BP 10/22/23 0841 114/83     Girls Systolic BP Percentile --      Girls Diastolic BP Percentile --      Boys Systolic BP Percentile --      Boys Diastolic BP Percentile --      Pulse Rate 10/22/23 0841 95     Resp 10/22/23 0841 17     Temp 10/22/23 0841 97.7 F (36.5 C)     Temp Source 10/22/23 0841 Oral     SpO2 10/22/23 0841 97 %     Weight 10/22/23 0841 137 lb (62.1 kg)     Height 10/22/23 0841 5' 1 (1.549 m)     Head Circumference --      Peak Flow --      Pain Score 10/22/23 0858 5     Pain Loc --      Pain Education --      Exclude from Growth Chart --    No data found.  Updated Vital Signs BP 114/83 (BP Location: Right Arm)   Pulse 95   Temp 97.7 F (36.5 C) (Oral)   Resp 17   Ht 5' 1 (1.549 m)   Wt 137 lb (62.1 kg)   SpO2 97%   BMI 25.89 kg/m   Visual Acuity Right Eye Distance:   Left Eye Distance:   Bilateral Distance:    Right Eye Near:   Left Eye Near:    Bilateral Near:     Physical Exam Vitals reviewed.  Constitutional:      General: She is awake.     Appearance: Normal appearance. She is well-developed and well-groomed.  HENT:     Head: Normocephalic and atraumatic.  Eyes:     General: Lids are normal. Gaze aligned appropriately.     Extraocular Movements: Extraocular movements intact.     Conjunctiva/sclera: Conjunctivae normal.  Pulmonary:     Effort: Pulmonary effort is normal.  Musculoskeletal:       Hands:  Neurological:     Mental Status: She is alert and oriented to person, place, and time.  Psychiatric:        Attention and Perception: Attention and perception normal.        Mood and Affect: Mood and affect normal.        Speech: Speech normal.        Behavior: Behavior normal. Behavior is  cooperative.  UC Treatments / Results  Labs (all labs ordered are listed, but only abnormal results are displayed) Labs Reviewed - No data to display  EKG   Radiology DG Finger Thumb Right Result Date: 10/22/2023 CLINICAL DATA:  foreign body removal confirmation EXAM: RIGHT THUMB 2+V COMPARISON:  10/22/2023 8:48 a.m. FINDINGS: No acute fracture or dislocation. Redemonstrated orthopedic hardware along the fifth metacarpal. Moderate soft tissue swelling about the distal thumb. Interval removal of the linear radiopaque foreign body in the palmar soft tissues of the distal first digit. IMPRESSION: Interval removal of the linear radiopaque foreign body in the palmar soft tissues of the distal first digit. Electronically Signed   By: Rogelia Myers M.D.   On: 10/22/2023 10:29   DG Finger Thumb Right Result Date: 10/22/2023 CLINICAL DATA:  Splinter from sheet metal in the thumb. EXAM: RIGHT THUMB 2+V COMPARISON:  None Available. FINDINGS: A tiny linear radiopaque foreign body is identified in the superficial soft tissues along the palmar aspect of the distal thumb. This measures only about 1 mm in length and a fraction of a mm in thickness. IMPRESSION: Tiny linear radiopaque foreign body in the superficial soft tissues along the palmar aspect of the distal thumb. Electronically Signed   By: Camellia Candle M.D.   On: 10/22/2023 09:18    Procedures Foreign Body Removal  Date/Time: 10/22/2023 9:28 AM  Performed by: Marylene Rocky BRAVO, PA-C Authorized by: Marylene Rocky BRAVO, PA-C   Consent:    Consent obtained:  Verbal   Consent given by:  Patient   Risks, benefits, and alternatives were discussed: yes     Risks discussed:  Bleeding, infection, pain, worsening of condition, poor cosmetic result and incomplete removal   Alternatives discussed:  No treatment, observation and referral Universal protocol:    Procedure explained and questions answered to patient or proxy's satisfaction: yes     Patient  identity confirmed:  Verbally with patient Location:    Location:  Finger   Finger location:  R thumb   Depth:  Intradermal   Tendon involvement:  None Pre-procedure details:    Imaging:  X-ray   Neurovascular status: intact     Preparation: Patient was prepped and draped in usual sterile fashion   Anesthesia:    Anesthesia method:  Local infiltration   Local anesthetic:  Lidocaine 1% w/o epi Procedure type:    Procedure complexity:  Simple Procedure details:    Localization method:  Visualized   Dissection of underlying tissues: no     Bloodless field: no     Removal mechanism:  Hemostat   Guidance: serial x-rays     Foreign bodies recovered:  1   Description:  Superficial splinter   Intact foreign body removal: yes   Post-procedure details:    Neurovascular status: intact     Confirmation:  Complete removal verified with imaging   Skin closure:  None   Dressing:  Open (no dressing)   Procedure completion:  Tolerated well, no immediate complications  (including critical care time)  Medications Ordered in UC Medications - No data to display  Initial Impression / Assessment and Plan / UC Course  I have reviewed the triage vital signs and the nursing notes.  Pertinent labs & imaging results that were available during my care of the patient were reviewed by me and considered in my medical decision making (see chart for details).      Final Clinical Impressions(s) / UC Diagnoses   Final diagnoses:  Foreign body (FB)  in soft tissue  Finger, superficial foreign body (splinter), initial encounter    Patient presents today with concerns for a metal splinter in the pad of the right thumb.  She states on Friday she was cutting sheet-metal and felt a sharp sensation of the finger which has since turned more persistent.  She states she has tried to remove the splinter at home without success.  Physical exam is notable for punctate formation in the pad of the right thumb.   Imaging was notable for very small, approximately 1 cm long metal splinter which was successfully removed per procedure note above.  Given the fact that splinter/foreign body was present for multiple days will start Keflex for antibiotic coverage.  Recommend keeping area clean with warm water  and gentle soap and patting dry.  ED and return precautions reviewed and provided in AVS.  Follow-up as needed.    Discharge Instructions      You were seen today for concerns of a splinter in your right thumb Imaging revealed a very small sliver of what appears to be metal in your thumb.  This was successfully removed in its entirety and verified on x-ray imaging.  You have stated that your tetanus booster is up-to-date so we did not readminister that today.  I am sending you home with an oral antibiotic for you to take by mouth twice per day for 5 days to help prevent potential infection caused by the foreign body.  Please make sure that you are keeping your hands clean with warm water  and gentle soap and do not manipulate or squeeze the area.  If you have any swelling, bruising, difficulty bending your thumb, fever or chills please return to urgent care or go to the emergency room as these could be signs of infection.     ED Prescriptions     Medication Sig Dispense Auth. Provider   cephALEXin (KEFLEX) 500 MG capsule Take 1 capsule (500 mg total) by mouth 2 (two) times daily for 5 days. 10 capsule Connelly [place] E, PA-C      PDMP not reviewed this encounter.   Yahye Siebert, Rocky BRAVO, PA-C 10/22/23 1233

## 2023-10-22 NOTE — Discharge Instructions (Addendum)
 You were seen today for concerns of a splinter in your right thumb Imaging revealed a very small sliver of what appears to be metal in your thumb.  This was successfully removed in its entirety and verified on x-ray imaging.  You have stated that your tetanus booster is up-to-date so we did not readminister that today.  I am sending you home with an oral antibiotic for you to take by mouth twice per day for 5 days to help prevent potential infection caused by the foreign body.  Please make sure that you are keeping your hands clean with warm water  and gentle soap and do not manipulate or squeeze the area.  If you have any swelling, bruising, difficulty bending your thumb, fever or chills please return to urgent care or go to the emergency room as these could be signs of infection.

## 2023-10-23 ENCOUNTER — Ambulatory Visit (HOSPITAL_COMMUNITY): Payer: Self-pay
